# Patient Record
Sex: Female | Born: 1965
Health system: Southern US, Community
[De-identification: ages and names within clinical notes are randomized; demographics above are authoritative.]

## PROBLEM LIST (undated history)

## (undated) DIAGNOSIS — F419 Anxiety disorder, unspecified: Secondary | ICD-10-CM

## (undated) DIAGNOSIS — E119 Type 2 diabetes mellitus without complications: Secondary | ICD-10-CM

## (undated) DIAGNOSIS — I10 Essential (primary) hypertension: Secondary | ICD-10-CM

## (undated) HISTORY — DX: Type 2 diabetes mellitus without complications: E11.9

## (undated) HISTORY — DX: Anxiety disorder, unspecified: F41.9

## (undated) HISTORY — DX: Essential (primary) hypertension: I10

## (undated) HISTORY — PX: LAPAROSCOPY: SHX197

---

## 2018-10-22 DIAGNOSIS — E119 Type 2 diabetes mellitus without complications: Secondary | ICD-10-CM | POA: Diagnosis not present

## 2018-10-22 DIAGNOSIS — I10 Essential (primary) hypertension: Secondary | ICD-10-CM | POA: Diagnosis not present

## 2018-10-22 DIAGNOSIS — Z79899 Other long term (current) drug therapy: Secondary | ICD-10-CM | POA: Diagnosis not present

## 2018-10-22 DIAGNOSIS — J3489 Other specified disorders of nose and nasal sinuses: Secondary | ICD-10-CM | POA: Diagnosis not present

## 2018-11-24 DIAGNOSIS — B372 Candidiasis of skin and nail: Secondary | ICD-10-CM | POA: Diagnosis not present

## 2018-11-24 DIAGNOSIS — I10 Essential (primary) hypertension: Secondary | ICD-10-CM | POA: Diagnosis not present

## 2018-11-24 DIAGNOSIS — Z01419 Encounter for gynecological examination (general) (routine) without abnormal findings: Secondary | ICD-10-CM | POA: Diagnosis not present

## 2018-11-24 DIAGNOSIS — Z6837 Body mass index (BMI) 37.0-37.9, adult: Secondary | ICD-10-CM | POA: Diagnosis not present

## 2018-11-24 DIAGNOSIS — N951 Menopausal and female climacteric states: Secondary | ICD-10-CM | POA: Diagnosis not present

## 2019-05-09 DIAGNOSIS — Z1231 Encounter for screening mammogram for malignant neoplasm of breast: Secondary | ICD-10-CM | POA: Diagnosis not present

## 2019-06-29 DIAGNOSIS — E669 Obesity, unspecified: Secondary | ICD-10-CM | POA: Diagnosis not present

## 2019-06-29 DIAGNOSIS — M9902 Segmental and somatic dysfunction of thoracic region: Secondary | ICD-10-CM | POA: Diagnosis not present

## 2019-06-29 DIAGNOSIS — M542 Cervicalgia: Secondary | ICD-10-CM | POA: Diagnosis not present

## 2019-06-29 DIAGNOSIS — E119 Type 2 diabetes mellitus without complications: Secondary | ICD-10-CM | POA: Diagnosis not present

## 2019-06-29 DIAGNOSIS — M9901 Segmental and somatic dysfunction of cervical region: Secondary | ICD-10-CM | POA: Diagnosis not present

## 2019-06-29 DIAGNOSIS — I10 Essential (primary) hypertension: Secondary | ICD-10-CM | POA: Diagnosis not present

## 2019-06-29 DIAGNOSIS — M546 Pain in thoracic spine: Secondary | ICD-10-CM | POA: Diagnosis not present

## 2019-06-29 DIAGNOSIS — F329 Major depressive disorder, single episode, unspecified: Secondary | ICD-10-CM | POA: Diagnosis not present

## 2019-07-01 DIAGNOSIS — M9902 Segmental and somatic dysfunction of thoracic region: Secondary | ICD-10-CM | POA: Diagnosis not present

## 2019-07-01 DIAGNOSIS — M9901 Segmental and somatic dysfunction of cervical region: Secondary | ICD-10-CM | POA: Diagnosis not present

## 2019-07-01 DIAGNOSIS — M546 Pain in thoracic spine: Secondary | ICD-10-CM | POA: Diagnosis not present

## 2019-07-01 DIAGNOSIS — M542 Cervicalgia: Secondary | ICD-10-CM | POA: Diagnosis not present

## 2019-07-04 DIAGNOSIS — M546 Pain in thoracic spine: Secondary | ICD-10-CM | POA: Diagnosis not present

## 2019-07-04 DIAGNOSIS — M9901 Segmental and somatic dysfunction of cervical region: Secondary | ICD-10-CM | POA: Diagnosis not present

## 2019-07-04 DIAGNOSIS — M9902 Segmental and somatic dysfunction of thoracic region: Secondary | ICD-10-CM | POA: Diagnosis not present

## 2019-07-04 DIAGNOSIS — M542 Cervicalgia: Secondary | ICD-10-CM | POA: Diagnosis not present

## 2019-07-09 DIAGNOSIS — M542 Cervicalgia: Secondary | ICD-10-CM | POA: Diagnosis not present

## 2019-07-09 DIAGNOSIS — M9902 Segmental and somatic dysfunction of thoracic region: Secondary | ICD-10-CM | POA: Diagnosis not present

## 2019-07-09 DIAGNOSIS — M546 Pain in thoracic spine: Secondary | ICD-10-CM | POA: Diagnosis not present

## 2019-07-09 DIAGNOSIS — M9901 Segmental and somatic dysfunction of cervical region: Secondary | ICD-10-CM | POA: Diagnosis not present

## 2019-07-12 DIAGNOSIS — M9903 Segmental and somatic dysfunction of lumbar region: Secondary | ICD-10-CM | POA: Diagnosis not present

## 2019-07-12 DIAGNOSIS — M9901 Segmental and somatic dysfunction of cervical region: Secondary | ICD-10-CM | POA: Diagnosis not present

## 2019-07-12 DIAGNOSIS — M546 Pain in thoracic spine: Secondary | ICD-10-CM | POA: Diagnosis not present

## 2019-07-12 DIAGNOSIS — M545 Low back pain: Secondary | ICD-10-CM | POA: Diagnosis not present

## 2019-07-12 DIAGNOSIS — M9902 Segmental and somatic dysfunction of thoracic region: Secondary | ICD-10-CM | POA: Diagnosis not present

## 2019-07-15 DIAGNOSIS — M9903 Segmental and somatic dysfunction of lumbar region: Secondary | ICD-10-CM | POA: Diagnosis not present

## 2019-07-15 DIAGNOSIS — M9902 Segmental and somatic dysfunction of thoracic region: Secondary | ICD-10-CM | POA: Diagnosis not present

## 2019-07-15 DIAGNOSIS — M546 Pain in thoracic spine: Secondary | ICD-10-CM | POA: Diagnosis not present

## 2019-07-15 DIAGNOSIS — M545 Low back pain: Secondary | ICD-10-CM | POA: Diagnosis not present

## 2019-07-18 DIAGNOSIS — M546 Pain in thoracic spine: Secondary | ICD-10-CM | POA: Diagnosis not present

## 2019-07-18 DIAGNOSIS — M9903 Segmental and somatic dysfunction of lumbar region: Secondary | ICD-10-CM | POA: Diagnosis not present

## 2019-07-18 DIAGNOSIS — M545 Low back pain: Secondary | ICD-10-CM | POA: Diagnosis not present

## 2019-07-18 DIAGNOSIS — M9902 Segmental and somatic dysfunction of thoracic region: Secondary | ICD-10-CM | POA: Diagnosis not present

## 2019-07-26 DIAGNOSIS — M9903 Segmental and somatic dysfunction of lumbar region: Secondary | ICD-10-CM | POA: Diagnosis not present

## 2019-07-26 DIAGNOSIS — M546 Pain in thoracic spine: Secondary | ICD-10-CM | POA: Diagnosis not present

## 2019-07-26 DIAGNOSIS — M545 Low back pain: Secondary | ICD-10-CM | POA: Diagnosis not present

## 2019-07-26 DIAGNOSIS — M9902 Segmental and somatic dysfunction of thoracic region: Secondary | ICD-10-CM | POA: Diagnosis not present

## 2019-08-30 ENCOUNTER — Encounter: Payer: Self-pay | Admitting: Sports Medicine

## 2019-08-30 ENCOUNTER — Other Ambulatory Visit: Payer: Self-pay

## 2019-08-30 ENCOUNTER — Ambulatory Visit (INDEPENDENT_AMBULATORY_CARE_PROVIDER_SITE_OTHER): Payer: BC Managed Care – PPO | Admitting: Sports Medicine

## 2019-08-30 ENCOUNTER — Ambulatory Visit (INDEPENDENT_AMBULATORY_CARE_PROVIDER_SITE_OTHER): Payer: BC Managed Care – PPO

## 2019-08-30 DIAGNOSIS — M1712 Unilateral primary osteoarthritis, left knee: Secondary | ICD-10-CM | POA: Diagnosis not present

## 2019-08-30 DIAGNOSIS — M5412 Radiculopathy, cervical region: Secondary | ICD-10-CM

## 2019-08-30 DIAGNOSIS — M1711 Unilateral primary osteoarthritis, right knee: Secondary | ICD-10-CM

## 2019-08-30 DIAGNOSIS — M542 Cervicalgia: Secondary | ICD-10-CM | POA: Diagnosis not present

## 2019-08-30 MED ORDER — MELOXICAM 15 MG PO TABS: ORAL_TABLET | ORAL | 3 refills | Status: DC

## 2019-08-30 MED ORDER — PREDNISONE 50 MG PO TABS: ORAL_TABLET | ORAL | 0 refills | Status: DC

## 2019-08-30 MED ORDER — CYCLOBENZAPRINE HCL 10 MG PO TABS: ORAL_TABLET | ORAL | 0 refills | Status: AC

## 2019-08-30 NOTE — Assessment & Plan Note (Signed)
This is a very pleasant 54 year old female, she has a long history of pain in her neck with radiation down the right arm to the first through third fingers. Better with abduction of the arm, worse with prolonged downgaze. Consistent with C6 and C7 cervical radiculitis secondary to DDD. Adding x-rays, formal physical therapy, 5 days of prednisone, switch from ibuprofen to meloxicam, adding Flexeril at bedtime. Return to see me in 6 weeks, MRI for interventional planning if no better.

## 2019-08-30 NOTE — Assessment & Plan Note (Signed)
Chronic pain at the medial joint line, gelling, no mechanical symptoms. Adding x-rays, meloxicam will be helpful, return to see me in 4 to 6 weeks for this. This is likely plain osteoarthritis.

## 2019-08-30 NOTE — Progress Notes (Signed)
    Procedures performed today:    None.  Independent interpretation of notes and tests performed by another provider:   None.  Brief History, Exam, Impression, and Recommendations:    Radiculitis of right cervical region This is a very pleasant 54 year old female, she has a long history of pain in her neck with radiation down the right arm to the first through third fingers. Better with abduction of the arm, worse with prolonged downgaze. Consistent with C6 and C7 cervical radiculitis secondary to DDD. Adding x-rays, formal physical therapy, 5 days of prednisone, switch from ibuprofen to meloxicam, adding Flexeril at bedtime. Return to see me in 6 weeks, MRI for interventional planning if no better.  Primary osteoarthritis of right knee Chronic pain at the medial joint line, gelling, no mechanical symptoms. Adding x-rays, meloxicam will be helpful, return to see me in 4 to 6 weeks for this. This is likely plain osteoarthritis.    ___________________________________________ Ihor Austin. Benjamin Stain, M.D., ABFM., CAQSM. Primary Care and Sports Medicine [facility]  Adjunct Instructor of Family Medicine  [employer]

## 2019-09-05 ENCOUNTER — Encounter: Payer: Self-pay | Admitting: Rehabilitative and Restorative Service Providers"

## 2019-09-05 ENCOUNTER — Ambulatory Visit (INDEPENDENT_AMBULATORY_CARE_PROVIDER_SITE_OTHER): Payer: BC Managed Care – PPO | Admitting: Rehabilitative and Restorative Service Providers"

## 2019-09-05 ENCOUNTER — Other Ambulatory Visit: Payer: Self-pay

## 2019-09-05 DIAGNOSIS — R29898 Other symptoms and signs involving the musculoskeletal system: Secondary | ICD-10-CM

## 2019-09-05 DIAGNOSIS — M6281 Muscle weakness (generalized): Secondary | ICD-10-CM

## 2019-09-05 DIAGNOSIS — M5412 Radiculopathy, cervical region: Secondary | ICD-10-CM

## 2019-09-05 DIAGNOSIS — M542 Cervicalgia: Secondary | ICD-10-CM | POA: Diagnosis not present

## 2019-09-05 DIAGNOSIS — R293 Abnormal posture: Secondary | ICD-10-CM

## 2019-09-05 NOTE — Therapy (Signed)
[facility] [place] [address] [place], [place], [address] Phone: [phone]   Fax:  [phone]  Physical Therapy Evaluation  Patient Details  Name: Janice Hurst MRN: [id number] Date of Birth: 1965/11/06 Referring Provider (PT): Dr Benjamin Stain    Encounter Date: 09/05/2019  PT End of Session - 09/05/19 1720    Visit Number  1    Number of Visits  12    Date for PT Re-Evaluation  10/17/19    PT Start Time  1510    PT Stop Time  1557    PT Time Calculation (min)  47 min    Activity Tolerance  Patient tolerated treatment well       Past Medical History:  Diagnosis Date  . Anxiety   . Diabetes mellitus without complication (HCC)   . Hypertension     Past Surgical History:  Procedure Laterality Date  . LAPAROSCOPY      There were no vitals filed for this visit.   Subjective Assessment - 09/05/19 1516    Subjective  Patient reports that she has been having pain in the Rt shoulder area and radiating pain and tingling into the Rt shoulder and arm which has been going on for more than 2 months with no known injury. She tried chiropractic care with no improvement.    Pertinent History  denies any medical problems    Patient Stated Goals  get rid of the pain and get back to normal activities    Currently in Pain?  Yes    Pain Score  7     Pain Location  Neck   neck to shoulder girdle into Rt arm   Pain Orientation  Right    Pain Descriptors / Indicators  Sharp;Tightness;Aching;Throbbing    Pain Type  Acute pain    Pain Radiating Towards  cervical spine to Rt shoulder girdle into the arm    Pain Onset  More than a month ago    Pain Frequency  Constant    Aggravating Factors   bending forward; pulling when moving supine to sit; sitting    Pain Relieving Factors  better up moving; icing; TENS         [facility] PT Assessment - 09/05/19 0001      Assessment   Medical Diagnosis  Rt Cervical radiculopathy      Referring Provider (PT)  Dr Benjamin Stain     Onset Date/Surgical Date  06/27/19    Hand Dominance  Right    Next MD Visit  10/11/19    Prior Therapy  chiropractic care 2-3 weeks; massage therapy       Precautions   Precautions  None      Balance Screen   Has the patient fallen in the past 6 months  No    Has the patient had a decrease in activity level because of a fear of falling?   No    Is the patient reluctant to leave their home because of a fear of falling?   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     Leisure  household chores; child care       Observation/Other Assessments   Focus on Therapeutic Outcomes (FOTO)   49% limitation  Sensation   Additional Comments  tingling into the Rt UE to thumb and index fingers       Posture/Postural Control   Posture Comments  head forward; shoulders rounded and elevated; scapulae abducted and rotated along the thoracic wall       AROM   Right Shoulder Flexion  142 Degrees    Right Shoulder ABduction  160 Degrees    Right Shoulder External Rotation  90 Degrees    Left Shoulder Flexion  158 Degrees    Left Shoulder ABduction  145 Degrees    Left Shoulder External Rotation  94 Degrees    Cervical Flexion  45    Cervical Extension  44   painful neck radiating to Rt UE    Cervical - Right Side Bend  37   sore and tight   Cervical - Left Side Bend  35    Cervical - Right Rotation  67   tight    Cervical - Left Rotation  65      Strength   Overall Strength Comments  Rt UE 4+/5 shoulder flexion; 5-/5 ER       Palpation   Spinal mobility  hypomobile cervical and upper thoracic spine with CPA and lateral mobs     Palpation comment  significant muscular tightness through the Rt ant/lat/post cervical spine; pecs; upper trap; leveator; teres                  Objective measurements completed on examination: See above findings.      [facility] Adult PT Treatment/Exercise - 09/05/19 0001      Neuro Re-ed    Neuro Re-ed Details   initiated postural correction/education       Shoulder Exercises: Seated   Other Seated Exercises  noodle along spine in siting to improve posture and alignment       Shoulder Exercises: Standing   Other Standing Exercises  axial extension 5 sec x 5 reps; scap squeeze 10 sec x 10 reps; L's x 10 all with noodle along spine       Shoulder Exercises: Stretch   Other Shoulder Stretches  instructed in prolonged snow angel in supine to begin at home 1-2 min as tolerated UE in 60-70 deg abd              PT Education - 09/05/19 1557    Education Details  HEP DN POC    Person(s) Educated  Patient    Methods  Explanation;Demonstration;Tactile cues;Verbal cues;Handout    Comprehension  Verbalized understanding;Returned demonstration;Verbal cues required;Tactile cues required          PT Long Term Goals - 09/05/19 1751      PT LONG TERM GOAL #1   Title  Decrease pai by 50-75% allowing patient to progress with exercise and return to more normal ADL's including sleep    Time  6    Period  Weeks    Status  New    Target Date  10/17/19      PT LONG TERM GOAL #2   Title  Full pain free cervical ROM    Time  6    Period  Weeks    Status  New    Target Date  10/17/19      PT LONG TERM GOAL #3   Title  5/5 strength Rt UE    Time  6    Period  Weeks    Status  New    Target Date  10/17/19      PT LONG TERM GOAL #4   Title  Independent in HEP    Time  6    Period  Weeks    Status  New    Target Date  10/17/19      PT LONG TERM GOAL #5   Title  Improve FOTO to </= 34% limitation    Time  6    Period  Weeks    Status  New    Target Date  10/17/19             Plan - 09/05/19 1737    Clinical Impression Statement  Patient presents with at least 2 month history of Rt UE  radiculopathy consistent with nerve irritation at the cervical level as well as muscular tightness in the cervical and shoulder girdle areas. She has poor posture and alignment; limited and painful cervical and shoulder ROM/movement; Rt UE weakness; limited functional activity level; difficulty sleeping; pain on a constant basis. Patient will benefit form PT to address problems identified.    Stability/Clinical Decision Making  Stable/Uncomplicated    Clinical Decision Making  Low    Rehab Potential  Good    PT Frequency  2x / week    PT Duration  6 weeks    PT Treatment/Interventions  Patient/family education;ADLs/Self Care Home Management;Cryotherapy;Electrical Stimulation;Iontophoresis 4mg /ml Dexamethasone;Moist Heat;Traction;Ultrasound;Therapeutic activities;Therapeutic exercise;Neuromuscular re-education;Manual techniques;Dry needling;Taping    PT Next Visit Plan  review HEP; DN and manual work through the cervical and Rt shoulder girdle musculature; postural correction; stretching and strengthening as pt tolerates - (very reactive with limited tolerance for eval and exercise at initial visit); modalities as indicated (has TENS unit at home/does well with ice)    PT Home Exercise Plan  [email or web address]    Consulted and Agree with Plan of Care  Patient       Patient will benefit from skilled therapeutic intervention in order to improve the following deficits and impairments:     Visit Diagnosis: Radiculopathy, cervical region - Plan: PT plan of care cert/re-cert  Cervicalgia - Plan: PT plan of care cert/re-cert  Muscle weakness (generalized) - Plan: PT plan of care cert/re-cert  Other symptoms and signs involving the musculoskeletal system - Plan: PT plan of care cert/re-cert  Abnormal posture - Plan: PT plan of care cert/re-cert     Problem List Patient Active Problem List   Diagnosis Date Noted  . Radiculitis of right cervical region 08/30/2019  . Primary osteoarthritis of right knee  08/30/2019    Lyzette Reinhardt 10/30/2019 PT, MPH  09/05/2019, 5:55 PM  [facility] [address] [place], [place], [place] Phone: [phone]   Fax:  [phone]  Name: Janice Hurst MRN: Abel Presto Date of Birth: [birth year]

## 2019-09-05 NOTE — Patient Instructions (Addendum)
Access Code: [email or web address]: [email or web address]: 05/10/2021Prepared by: Celyn HoltExercises  Seated Cervical Retraction - 3-4 x daily - 7 x weekly - 1 sets - 5 reps - 10 sec hold  Seated Scapular Retraction - 3-4 x daily - 7 x weekly - 1 sets - 10 reps - 5-10 sec hold  Shoulder External Rotation and Scapular Retraction - 2 x daily - 7 x weekly - 1 sets - 10 reps - 1-2 sec hold Patient Education  Trigger Point Dry Needling Trigger Point Dry Needling  . What is Trigger Point Dry Needling (DN)? o DN is a physical therapy technique used to treat muscle pain and dysfunction. Specifically, DN helps deactivate muscle trigger points (muscle knots).  o A thin filiform needle is used to penetrate the skin and stimulate the underlying trigger point. The goal is for a local twitch response (LTR) to occur and for the trigger point to relax. No medication of any kind is injected during the procedure.   . What Does Trigger Point Dry Needling Feel Like?  o The procedure feels different for each individual patient. Some patients report that they do not actually feel the needle enter the skin and overall the process is not painful. Very mild bleeding may occur. However, many patients feel a deep cramping in the muscle in which the needle was inserted. This is the local twitch response.   Marland Kitchen How Will I feel after the treatment? o Soreness is normal, and the onset of soreness may not occur for a few hours. Typically this soreness does not last longer than two days.  o Bruising is uncommon, however; ice can be used to decrease any possible bruising.  o In rare cases feeling tired or nauseous after the treatment is normal. In addition, your symptoms may get worse before they get better, this period will typically not last longer than 24 hours.   . What Can I do After My Treatment? o Increase your hydration by drinking more water for the next 24 hours. o You may place ice or heat on the areas treated  that have become sore, however, do not use heat on inflamed or bruised areas. Heat often brings more relief post needling. o You can continue your regular activities, but vigorous activity is not recommended initially after the treatment for 24 hours. o DN is best combined with other physical therapy such as strengthening, stretching, and other therapies.

## 2019-09-08 ENCOUNTER — Encounter: Payer: Self-pay | Admitting: Rehabilitative and Restorative Service Providers"

## 2019-09-08 ENCOUNTER — Ambulatory Visit (INDEPENDENT_AMBULATORY_CARE_PROVIDER_SITE_OTHER): Payer: BC Managed Care – PPO | Admitting: Rehabilitative and Restorative Service Providers"

## 2019-09-08 ENCOUNTER — Other Ambulatory Visit: Payer: Self-pay

## 2019-09-08 DIAGNOSIS — M542 Cervicalgia: Secondary | ICD-10-CM | POA: Diagnosis not present

## 2019-09-08 DIAGNOSIS — M5412 Radiculopathy, cervical region: Secondary | ICD-10-CM

## 2019-09-08 DIAGNOSIS — R29898 Other symptoms and signs involving the musculoskeletal system: Secondary | ICD-10-CM | POA: Diagnosis not present

## 2019-09-08 DIAGNOSIS — M6281 Muscle weakness (generalized): Secondary | ICD-10-CM | POA: Diagnosis not present

## 2019-09-08 DIAGNOSIS — R293 Abnormal posture: Secondary | ICD-10-CM

## 2019-09-08 NOTE — Patient Instructions (Signed)

## 2019-09-08 NOTE — Therapy (Signed)
[place], [place], [address] Phone: [phone]   Fax:  [phone]  Physical Therapy Treatment  Patient Details  Name: Janice Hurst MRN: [id number] Date of Birth: Dec 20, 1965 Referring Provider (PT): Dr Dianah Field    Encounter Date: 09/08/2019  PT End of Session - 09/08/19 1534    Visit Number  2    Number of Visits  12    Date for PT Re-Evaluation  10/17/19    PT Start Time  7616    PT Stop Time  1625    PT Time Calculation (min)  54 min    Activity Tolerance  Patient tolerated treatment well       Past Medical History:  Diagnosis Date  . Anxiety   . Diabetes mellitus without complication ([place])   . Hypertension     Past Surgical History:  Procedure Laterality Date  . LAPAROSCOPY      There were no vitals filed for this visit.  Subjective Assessment - 09/08/19 1534    Subjective  Patient reports that her symptoms just come and go. Has been working on her exercises and posture at home and at work    Currently in Pain?  Yes    Pain Score  4     Pain Location  Neck    Pain Orientation  Right    Pain Descriptors / Indicators  Aching;Sore;Tightness    Pain Type  Acute pain    Pain Onset  More than a month ago    Pain Frequency  Constant                        [facility] Adult PT Treatment/Exercise - 09/08/19 0001      Shoulder Exercises: Standing   Other Standing Exercises  axial extension 5 sec x 5 reps; scap squeeze 10 sec x 10 reps; L's x 10 all with noodle along spine       Shoulder Exercises: Stretch   Other Shoulder Stretches  prolonged snow angel in supine to begin at home 1-2 min as tolerated UE in 60-70 deg abd       Moist Heat Therapy   Number Minutes Moist Heat  15 Minutes    Moist Heat Location  Cervical;Shoulder   thoracic spine     Electrical Stimulation   Electrical Stimulation Location  Rt cervical and trap area     Electrical Stimulation  Action  TENS    Electrical Stimulation Parameters  to tolerance    Electrical Stimulation Goals  Pain;Tone      Manual Therapy   Manual therapy comments  skilled palpation to assess tissue response to DN; pt prone     Joint Mobilization  cervical PA mobs Grade II     Soft tissue mobilization  deep tissue work Rt posterior lateral cervical musculature; upper thoracic/upper trap/leveator; periscapular musculatue/teres     Myofascial Release  posterior cervical to upper thoracic paraspinals        Trigger Point Dry Needling - 09/08/19 0001    Consent Given?  Yes    Education Handout Provided  Yes    Muscles Treated Head and Neck  Upper trapezius;Oblique capitus;Suboccipitals;Levator scapulae;Scalenes;Cervical multifidi    Muscles Treated Upper Quadrant  Infraspinatus    Upper Trapezius Response  Palpable increased muscle length    Oblique Capitus Response  Palpable increased muscle length    Suboccipitals Response  Palpable increased muscle length    Levator  Scapulae Response  Palpable increased muscle length    Scalenes Response  Palpable increased muscle length    Cervical multifidi Response  Palpable increased muscle length    Infraspinatus Response  Palpable increased muscle length           PT Education - 09/08/19 1612    Education Details  DN    Person(s) Educated  Patient    Methods  Explanation    Comprehension  Verbalized understanding          PT Long Term Goals - 09/05/19 1751      PT LONG TERM GOAL #1   Title  Decrease pai by 50-75% allowing patient to progress with exercise and return to more normal ADL's including sleep    Time  6    Period  Weeks    Status  New    Target Date  10/17/19      PT LONG TERM GOAL #2   Title  Full pain free cervical ROM    Time  6    Period  Weeks    Status  New    Target Date  10/17/19      PT LONG TERM GOAL #3   Title  5/5 strength Rt UE    Time  6    Period  Weeks    Status  New    Target Date  10/17/19       PT LONG TERM GOAL #4   Title  Independent in HEP    Time  6    Period  Weeks    Status  New    Target Date  10/17/19      PT LONG TERM GOAL #5   Title  Improve FOTO to </= 34% limitation    Time  6    Period  Weeks    Status  New    Target Date  10/17/19            Plan - 09/08/19 1612    Clinical Impression Statement  Patiett reports that she has been working on exercise and posture at home - has had some soreness but no increase in pain. Good response to DN/manual work and modalities today. Good tissue release and improved tissue extensibility.    Rehab Potential  Good    PT Frequency  2x / week    PT Duration  6 weeks    PT Treatment/Interventions  Patient/family education;ADLs/Self Care Home Management;Cryotherapy;Electrical Stimulation;Iontophoresis 4mg /ml Dexamethasone;Moist Heat;Traction;Ultrasound;Therapeutic activities;Therapeutic exercise;Neuromuscular re-education;Manual techniques;Dry needling;Taping    PT Next Visit Plan  review HEP; DN and manual work through the cervical and Rt shoulder girdle musculature; postural correction; stretching and strengthening as pt tolerates - (very reactive with limited tolerance for eval and exercise at initial visit); modalities as indicated; assess response to DN and manual work    PT Home Exercise Plan  [email or web address]    Consulted and Agree with Plan of Care  Patient       Patient will benefit from skilled therapeutic intervention in order to improve the following deficits and impairments:     Visit Diagnosis: Radiculopathy, cervical region  Cervicalgia  Muscle weakness (generalized)  Other symptoms and signs involving the musculoskeletal system  Abnormal posture     Problem List Patient Active Problem List   Diagnosis Date Noted  . Radiculitis of right cervical region 08/30/2019  . Primary osteoarthritis of right knee 08/30/2019    Tyrisha Benninger 10/30/2019 PT, MPH  09/08/2019, 4:16 PM  [facility]  [place] [address] [place] [address] [place], [place], [address] Phone: [phone]   Fax:  [phone]  Name: Janice Hurst MRN: [id number] Date of Birth: [birth year]

## 2019-09-12 ENCOUNTER — Other Ambulatory Visit: Payer: Self-pay

## 2019-09-12 ENCOUNTER — Ambulatory Visit (INDEPENDENT_AMBULATORY_CARE_PROVIDER_SITE_OTHER): Payer: BC Managed Care – PPO | Admitting: Rehabilitative and Restorative Service Providers"

## 2019-09-12 ENCOUNTER — Encounter: Payer: Self-pay | Admitting: Rehabilitative and Restorative Service Providers"

## 2019-09-12 DIAGNOSIS — R29898 Other symptoms and signs involving the musculoskeletal system: Secondary | ICD-10-CM | POA: Diagnosis not present

## 2019-09-12 DIAGNOSIS — M542 Cervicalgia: Secondary | ICD-10-CM

## 2019-09-12 DIAGNOSIS — R293 Abnormal posture: Secondary | ICD-10-CM

## 2019-09-12 DIAGNOSIS — M6281 Muscle weakness (generalized): Secondary | ICD-10-CM

## 2019-09-12 DIAGNOSIS — M5412 Radiculopathy, cervical region: Secondary | ICD-10-CM

## 2019-09-12 NOTE — Patient Instructions (Signed)
Access Code: [email or web address]: [email or web address]: 05/10/2021Prepared by: Deshauna Cayson HoltExercises  Seated Cervical Retraction - 3-4 x daily - 7 x weekly - 1 sets - 5 reps - 10 sec hold  Seated Scapular Retraction - 3-4 x daily - 7 x weekly - 1 sets - 10 reps - 5-10 sec hold  Shoulder External Rotation and Scapular Retraction - 2 x daily - 7 x weekly - 1 sets - 10 reps - 1-2 sec hold  Doorway Pec Stretch at 60 Degrees Abduction - 3 x daily - 7 x weekly - 3 reps - 1 sets  Doorway Pec Stretch at 90 Degrees Abduction - 3 x daily - 7 x weekly - 3 reps - 1 sets - 30 seconds hold  Doorway Pec Stretch at 120 Degrees Abduction - 3 x daily - 7 x weekly - 3 reps - 1 sets - 30 second hold hold Patient Education  Trigger Point Dry Needling

## 2019-09-12 NOTE — Therapy (Signed)
[facility] [place] [address] [place], [place], [address] Phone: [phone]   Fax:  [phone]  Physical Therapy Treatment  Patient Details  Name: Janice Hurst MRN: [id number] Date of Birth: 27-Aug-1965 Referring Provider (PT): Dr Benjamin Stain    Encounter Date: 09/12/2019  PT End of Session - 09/12/19 1438    Visit Number  3    Number of Visits  12    Date for PT Re-Evaluation  10/17/19    PT Start Time  1436    PT Stop Time  1526    PT Time Calculation (min)  50 min    Activity Tolerance  Patient tolerated treatment well       Past Medical History:  Diagnosis Date  . Anxiety   . Diabetes mellitus without complication (HCC)   . Hypertension     Past Surgical History:  Procedure Laterality Date  . LAPAROSCOPY      There were no vitals filed for this visit.  Subjective Assessment - 09/12/19 1439    Subjective  Patient reports that she was sore from the needling but that it definitely helped. Working on exercises and posture at work and home    Currently in Pain?  Yes    Pain Score  4     Pain Location  Neck    Pain Orientation  Right    Pain Descriptors / Indicators  Aching;Sore;Tightness    Pain Type  Acute pain                        [facility] Adult PT Treatment/Exercise - 09/12/19 0001      Shoulder Exercises: Standing   Other Standing Exercises  axial extension 5 sec x 5 reps; scap squeeze 10 sec x 10 reps; L's x 10 all with noodle along spine       Shoulder Exercises: Stretch   Other Shoulder Stretches  doorway stretch 3 positions 30 sec x 2       Moist Heat Therapy   Number Minutes Moist Heat  10 Minutes    Moist Heat Location  Cervical;Shoulder   thoracic spine     Electrical Stimulation   Electrical Stimulation Location  Rt cervical and trap area     Electrical Stimulation Action  TENS     Electrical Stimulation Parameters  to tolerance    Electrical Stimulation Goals   Pain;Tone      Manual Therapy   Manual therapy comments  skilled palpation to assess tissue response to DN; pt prone     Joint Mobilization  cervical PA mobs Grade II     Soft tissue mobilization  deep tissue work Rt posterior lateral cervical musculature; focus on the lateral cervical/scaleni musculature    Myofascial Release  posterior cervical to upper thoracic paraspinals        Trigger Point Dry Needling - 09/12/19 0001    Consent Given?  Yes    Education Handout Provided  Previously provided    Oblique Capitus Response  Palpable increased muscle length    Suboccipitals Response  Palpable increased muscle length    Scalenes Response  Palpable increased muscle length    Cervical multifidi Response  Palpable increased muscle length           PT Education - 09/12/19 1456    Education Details  HEP    Person(s) Educated  Patient    Methods  Explanation;Demonstration;Tactile cues;Verbal cues;Handout    Comprehension  Verbalized understanding;Returned demonstration;Verbal cues required;Tactile cues required          PT Long Term Goals - 09/05/19 1751      PT LONG TERM GOAL #1   Title  Decrease pai by 50-75% allowing patient to progress with exercise and return to more normal ADL's including sleep    Time  6    Period  Weeks    Status  New    Target Date  10/17/19      PT LONG TERM GOAL #2   Title  Full pain free cervical ROM    Time  6    Period  Weeks    Status  New    Target Date  10/17/19      PT LONG TERM GOAL #3   Title  5/5 strength Rt UE    Time  6    Period  Weeks    Status  New    Target Date  10/17/19      PT LONG TERM GOAL #4   Title  Independent in HEP    Time  6    Period  Weeks    Status  New    Target Date  10/17/19      PT LONG TERM GOAL #5   Title  Improve FOTO to </= 34% limitation    Time  6    Period  Weeks    Status  New    Target Date  10/17/19            Plan - 09/12/19 1446    Clinical Impression Statement  Good  response to DN/manual work cervical musculature. Patient reports decrease in frequency, intensity and duration of pain as well sa centralization of symptoms. Patient has decreased palpable tightness through the cervical musculature.    Rehab Potential  Good    PT Frequency  2x / week    PT Duration  6 weeks    PT Treatment/Interventions  Patient/family education;ADLs/Self Care Home Management;Cryotherapy;Electrical Stimulation;Iontophoresis 4mg /ml Dexamethasone;Moist Heat;Traction;Ultrasound;Therapeutic activities;Therapeutic exercise;Neuromuscular re-education;Manual techniques;Dry needling;Taping    PT Home Exercise Plan  [email or web address]       Patient will benefit from skilled therapeutic intervention in order to improve the following deficits and impairments:     Visit Diagnosis: Radiculopathy, cervical region  Cervicalgia  Muscle weakness (generalized)  Other symptoms and signs involving the musculoskeletal system  Abnormal posture     Problem List Patient Active Problem List   Diagnosis Date Noted  . Radiculitis of right cervical region 08/30/2019  . Primary osteoarthritis of right knee 08/30/2019    Janice Hurst Nilda Simmer PT, MPH  09/12/2019, 3:16 PM  [facility] [place], [place], [address] Phone: [phone]   Fax:  [phone]  Name: Janice Hurst MRN: [id number] Date of Birth: 1965-09-09

## 2019-09-15 ENCOUNTER — Other Ambulatory Visit: Payer: Self-pay

## 2019-09-15 ENCOUNTER — Ambulatory Visit (INDEPENDENT_AMBULATORY_CARE_PROVIDER_SITE_OTHER): Payer: BC Managed Care – PPO | Admitting: Rehabilitative and Restorative Service Providers"

## 2019-09-15 ENCOUNTER — Encounter: Payer: Self-pay | Admitting: Rehabilitative and Restorative Service Providers"

## 2019-09-15 DIAGNOSIS — M542 Cervicalgia: Secondary | ICD-10-CM

## 2019-09-15 DIAGNOSIS — M6281 Muscle weakness (generalized): Secondary | ICD-10-CM | POA: Diagnosis not present

## 2019-09-15 DIAGNOSIS — R29898 Other symptoms and signs involving the musculoskeletal system: Secondary | ICD-10-CM | POA: Diagnosis not present

## 2019-09-15 DIAGNOSIS — M5412 Radiculopathy, cervical region: Secondary | ICD-10-CM | POA: Diagnosis not present

## 2019-09-15 DIAGNOSIS — R293 Abnormal posture: Secondary | ICD-10-CM

## 2019-09-15 NOTE — Patient Instructions (Signed)
Resisted External Rotation: in Neutral - Bilateral   PALMS UP Sit or stand, tubing in both hands, elbows at sides, bent to 90, forearms forward. Pinch shoulder blades together and rotate forearms out. Keep elbows at sides. Repeat __10__ times per set. Do _2-3___ sets per session. Do _2-3___ sessions per day.   Low Row: Standing   Face anchor, feet shoulder width apart. Palms up, pull arms back, squeezing shoulder blades together. Repeat 10__ times per set. Do 2-3__ sets per session. Do 1-2__ sessions per week. Anchor Height: Waist    Strengthening: Resisted Extension   Hold tubing in right hand, arm forward. Pull arm back, elbow straight. Repeat _10___ times per set. Do 2-3____ sets per session. Do 1-2___ sessions per day.   

## 2019-09-15 NOTE — Therapy (Signed)
[place], [place], [address] Phone: [phone]   Fax:  [phone]  Physical Therapy Treatment  Patient Details  Name: VIOLET SEABURY MRN: [id number] Date of Birth: Jul 05, 1965 Referring Provider (PT): Dr Dianah Field    Encounter Date: 09/15/2019  PT End of Session - 09/15/19 1624    Visit Number  4    Number of Visits  12    Date for PT Re-Evaluation  10/17/19    PT Start Time  1619    PT Stop Time  1707    PT Time Calculation (min)  48 min    Activity Tolerance  Patient tolerated treatment well       Past Medical History:  Diagnosis Date  . Anxiety   . Diabetes mellitus without complication ([place])   . Hypertension     Past Surgical History:  Procedure Laterality Date  . LAPAROSCOPY      There were no vitals filed for this visit.  Subjective Assessment - 09/15/19 1624    Subjective  Patient reports that she is sore the day following the DN - improving with less radicular problems. Doing her exercises. Working on posture and alignment. Less aching during the day.    Currently in Pain?  Yes    Pain Score  2     Pain Location  Neck    Pain Orientation  Right    Pain Descriptors / Indicators  Sore;Aching;Nagging    Pain Type  Acute pain    Pain Frequency  Intermittent                        [facility] Adult PT Treatment/Exercise - 09/15/19 0001      Shoulder Exercises: Standing   Extension  Strengthening;Both;20 reps;Theraband    Theraband Level (Shoulder Extension)  Level 2 (Red)    Row  Strengthening;Both;20 reps;Theraband    Theraband Level (Shoulder Row)  Level 2 (Red)    Retraction  Strengthening;Both;Theraband;20 reps    Theraband Level (Shoulder Retraction)  Level 1 (Yellow)    Other Standing Exercises  axial extension 5 sec x 5 reps; scap squeeze 10 sec x 10 reps; L's x 10 all with noodle along spine       Shoulder Exercises: ROM/Strengthening   UBE  (Upper Arm Bike)  L2 x 4 min; 2 min fwd/2 min back       Shoulder Exercises: Stretch   Other Shoulder Stretches  doorway stretch 3 positions 30 sec x 2       Moist Heat Therapy   Number Minutes Moist Heat  10 Minutes    Moist Heat Location  Cervical;Shoulder   thoracic spine     Electrical Stimulation   Electrical Stimulation Location  Rt cervical and trap area; upper thoracic paraspinals      Electrical Stimulation Action  TENs     Electrical Stimulation Parameters  to tolerance    Electrical Stimulation Goals  Pain;Tone      Manual Therapy   Manual therapy comments  skilled palpation to assess tissue response to DN; pt prone     Joint Mobilization  cervical PA mobs Grade II     Soft tissue mobilization  deep tissue work Rt posterior lateral cervical musculature; lateral cervical/scaleni musculature; thoracic paraspinals     Myofascial Release  posterior cervical to upper thoracic paraspinals        Trigger Point Dry Needling - 09/15/19 0001  Consent Given?  Yes    Education Handout Provided  Previously provided    Other Dry Needling  Rt    Upper Trapezius Response  Palpable increased muscle length    Levator Scapulae Response  Palpable increased muscle length    Scalenes Response  Palpable increased muscle length    Cervical multifidi Response  Palpable increased muscle length    Longissimus Response  Palpable increased muscle length   thoracic paraspinals Rt           PT Education - 09/15/19 1634    Education Details  HEP    Person(s) Educated  Patient    Methods  Explanation;Demonstration;Tactile cues;Verbal cues;Handout    Comprehension  Verbalized understanding;Returned demonstration;Verbal cues required;Tactile cues required          PT Long Term Goals - 09/05/19 1751      PT LONG TERM GOAL #1   Title  Decrease pai by 50-75% allowing patient to progress with exercise and return to more normal ADL's including sleep    Time  6    Period  Weeks     Status  New    Target Date  10/17/19      PT LONG TERM GOAL #2   Title  Full pain free cervical ROM    Time  6    Period  Weeks    Status  New    Target Date  10/17/19      PT LONG TERM GOAL #3   Title  5/5 strength Rt UE    Time  6    Period  Weeks    Status  New    Target Date  10/17/19      PT LONG TERM GOAL #4   Title  Independent in HEP    Time  6    Period  Weeks    Status  New    Target Date  10/17/19      PT LONG TERM GOAL #5   Title  Improve FOTO to </= 34% limitation    Time  6    Period  Weeks    Status  New    Target Date  10/17/19            Plan - 09/15/19 1748    Clinical Impression Statement  Continued progress with decreased radicular symptoms; improved upright posture; cervical mobility and report of decreased pain. Patient has muscular tightness through the Rt thoracic paraspinals; Rt > Lt cervical musculture. Good response to DN with decreased palpable tightness post treatment. Added posterior shoulder girdle strengthening without difficulty. Will benefit from continued manual work through ant/lat/posterior cervical musculature.    Rehab Potential  Good    PT Frequency  2x / week    PT Duration  6 weeks    PT Treatment/Interventions  Patient/family education;ADLs/Self Care Home Management;Cryotherapy;Electrical Stimulation;Iontophoresis 4mg /ml Dexamethasone;Moist Heat;Traction;Ultrasound;Therapeutic activities;Therapeutic exercise;Neuromuscular re-education;Manual techniques;Dry needling;Taping    PT Next Visit Plan  DN and manual work through the cervical and Rt shoulder girdle musculature; postural correction; stretching and strengthening as pt tolerates;; modalities as indicated    PT Home Exercise Plan  [email or web address]    Consulted and Agree with Plan of Care  Patient       Patient will benefit from skilled therapeutic intervention in order to improve the following deficits and impairments:     Visit Diagnosis: Radiculopathy, cervical  region  Cervicalgia  Muscle weakness (generalized)  Other symptoms and signs involving the musculoskeletal system  Abnormal  posture     Problem List Patient Active Problem List   Diagnosis Date Noted  . Radiculitis of right cervical region 08/30/2019  . Primary osteoarthritis of right knee 08/30/2019    Aboubacar Matsuo Rober Minion PT, MPH  09/15/2019, 6:01 PM  [facility] [address] [place] [place], [place], [address] Phone: [phone]   Fax:  [phone]  Name: SCHERRY LAVERNE MRN: [id number] Date of Birth: 1966-01-26

## 2019-09-16 MED ORDER — PREDNISONE 10 MG (48) PO TBPK: ORAL_TABLET | Freq: Every day | ORAL | 0 refills | Status: DC

## 2019-09-19 ENCOUNTER — Encounter: Payer: Self-pay | Admitting: Rehabilitative and Restorative Service Providers"

## 2019-09-19 ENCOUNTER — Other Ambulatory Visit: Payer: Self-pay

## 2019-09-19 ENCOUNTER — Ambulatory Visit (INDEPENDENT_AMBULATORY_CARE_PROVIDER_SITE_OTHER): Payer: BC Managed Care – PPO | Admitting: Rehabilitative and Restorative Service Providers"

## 2019-09-19 DIAGNOSIS — M542 Cervicalgia: Secondary | ICD-10-CM

## 2019-09-19 DIAGNOSIS — M5412 Radiculopathy, cervical region: Secondary | ICD-10-CM

## 2019-09-19 DIAGNOSIS — M6281 Muscle weakness (generalized): Secondary | ICD-10-CM | POA: Diagnosis not present

## 2019-09-19 DIAGNOSIS — R29898 Other symptoms and signs involving the musculoskeletal system: Secondary | ICD-10-CM

## 2019-09-19 DIAGNOSIS — R293 Abnormal posture: Secondary | ICD-10-CM

## 2019-09-19 NOTE — Therapy (Signed)
[facility] [place] [address] [place] [address] [place], [place], [address] Phone: [phone]   Fax:  [phone]  Physical Therapy Treatment  Patient Details  Name: Janice Hurst MRN: [id number] Date of Birth: [birth year] Referring Provider (PT): Dr Benjamin Stain    Encounter Date: 09/19/2019  PT End of Session - 09/19/19 1520    Visit Number  5    Number of Visits  12    Date for PT Re-Evaluation  10/17/19    PT Start Time  1516    PT Stop Time  1600    PT Time Calculation (min)  44 min    Activity Tolerance  Patient tolerated treatment well       Past Medical History:  Diagnosis Date  . Anxiety   . Diabetes mellitus without complication (HCC)   . Hypertension     Past Surgical History:  Procedure Laterality Date  . LAPAROSCOPY      There were no vitals filed for this visit.  Subjective Assessment - 09/19/19 1518    Subjective  The patient reports her neck is feeling significantly better.  She is doing her exercises and was able to travel this weekend.  Her low back is irritated today from travel in the car x 4 hours.  DN has helped reduce muscle tightness.    Pertinent History  denies any medical problems    Patient Stated Goals  get rid of the pain and get back to normal activities    Currently in Pain?  No/denies   discomfort   Pain Radiating Towards  sometimes feels a "crunching" sensation in the R paraspinal musculature    Pain Onset  More than a month ago    Pain Frequency  Intermittent    Pain Relieving Factors  TENSj, dry needling         [facility] PT Assessment - 09/19/19 1553      AROM   Overall AROM Comments  no pain with AROM today    Right Shoulder ABduction  160 Degrees   demontrates she can lift her arm to the side against gravity   Cervical Flexion  45    Cervical Extension  45    Cervical - Right Side Bend  32    Cervical - Left Side Bend  30                    [facility] Adult PT  Treatment/Exercise - 09/19/19 1521      Exercises   Exercises  Neck      Neck Exercises: Standing   Neck Retraction  5 reps    Other Standing Exercises  scapular squeeze with pool noodle      Neck Exercises: Supine   Neck Retraction  10 reps    Other Supine Exercise  retraction + horizontal abduction with yellow theraband x 10 reps      Neck Exercises: Prone   W Back  10 reps    Other Prone Exercise  shoulder extension x 10 reps with scap squeeze x 10 reps      Shoulder Exercises: ROM/Strengthening   UBE (Upper Arm Bike)  L2 x 2.5 minutes forward/ 1 minute backward      Manual Therapy   Manual Therapy  Soft tissue mobilization;Joint mobilization;Scapular mobilization    Manual therapy comments  to reduce muscle tightness, trigger points, and reduce pain    Joint Mobilization  cervical PA mobilization grade II prone, PA c-spine grade III mob mid  c-spine, lateral mob grade II R>L and L>R in supine    Soft tissue mobilization  deep tissue mobiization R posterior lateral cervical muscuature    Myofascial Release  posterior cervical and sub occipitals    Scapular Mobilization  Left sidelying, R scapular mobilization with STM medial border and into levator                  PT Long Term Goals - 09/05/19 1751      PT LONG TERM GOAL #1   Title  Decrease pai by 50-75% allowing patient to progress with exercise and return to more normal ADL's including sleep    Time  6    Period  Weeks    Status  New    Target Date  10/17/19      PT LONG TERM GOAL #2   Title  Full pain free cervical ROM    Time  6    Period  Weeks    Status  New    Target Date  10/17/19      PT LONG TERM GOAL #3   Title  5/5 strength Rt UE    Time  6    Period  Weeks    Status  New    Target Date  10/17/19      PT LONG TERM GOAL #4   Title  Independent in HEP    Time  6    Period  Weeks    Status  New    Target Date  10/17/19      PT LONG TERM GOAL #5   Title  Improve FOTO to </= 34%  limitation    Time  6    Period  Weeks    Status  New    Target Date  10/17/19            Plan - 09/19/19 2023    Clinical Impression Statement  The patient continues with reduced pain, noting occasional discomfort R paraspinal mid c-spine region. She also noted end range tightness for flexion/extension that improved with c-spine mobilization noting pain free ROM today.  PT performed STM R upper trap and levator and worked on posterior shoulder girdle strengthening and neck stabiization.  Continue working to [employer].    Rehab Potential  Good    PT Frequency  2x / week    PT Duration  6 weeks    PT Treatment/Interventions  Patient/family education;ADLs/Self Care Home Management;Cryotherapy;Electrical Stimulation;Iontophoresis 4mg /ml Dexamethasone;Moist Heat;Traction;Ultrasound;Therapeutic activities;Therapeutic exercise;Neuromuscular re-education;Manual techniques;Dry needling;Taping    PT Next Visit Plan  DN and manual work through the cervical and Rt shoulder girdle musculature; postural correction; stretching and strengthening as pt tolerates;; modalities as indicated    PT Home Exercise Plan  [email or web address]    Consulted and Agree with Plan of Care  Patient       Patient will benefit from skilled therapeutic intervention in order to improve the following deficits and impairments:     Visit Diagnosis: Radiculopathy, cervical region  Cervicalgia  Muscle weakness (generalized)  Other symptoms and signs involving the musculoskeletal system  Abnormal posture     Problem List Patient Active Problem List   Diagnosis Date Noted  . Radiculitis of right cervical region 08/30/2019  . Primary osteoarthritis of right knee 08/30/2019    [place], PT 09/19/2019, 8:26 PM  [facility] [place], [place], [address] Phone: [phone]   Fax:  [phone]  Name: Janice Hurst MRN:  [id number] Date of Birth:  1966-01-07

## 2019-09-22 ENCOUNTER — Ambulatory Visit (INDEPENDENT_AMBULATORY_CARE_PROVIDER_SITE_OTHER): Payer: BC Managed Care – PPO | Admitting: Physical Therapy

## 2019-09-22 ENCOUNTER — Other Ambulatory Visit: Payer: Self-pay

## 2019-09-22 ENCOUNTER — Encounter: Payer: Self-pay | Admitting: Physical Therapy

## 2019-09-22 DIAGNOSIS — R29898 Other symptoms and signs involving the musculoskeletal system: Secondary | ICD-10-CM | POA: Diagnosis not present

## 2019-09-22 DIAGNOSIS — M542 Cervicalgia: Secondary | ICD-10-CM | POA: Diagnosis not present

## 2019-09-22 DIAGNOSIS — M5412 Radiculopathy, cervical region: Secondary | ICD-10-CM

## 2019-09-22 DIAGNOSIS — R293 Abnormal posture: Secondary | ICD-10-CM

## 2019-09-22 DIAGNOSIS — M6281 Muscle weakness (generalized): Secondary | ICD-10-CM

## 2019-09-22 NOTE — Therapy (Signed)
[place], [place], [address] Phone: [phone]   Fax:  [phone]  Physical Therapy Treatment  Patient Details  Name: Janice Hurst MRN: [id number] Date of Birth: 24-Sep-1965 Referring Provider (PT): Dr Dianah Field    Encounter Date: 09/22/2019  PT End of Session - 09/22/19 1655    Visit Number  6    Number of Visits  12    Date for PT Re-Evaluation  10/17/19    PT Start Time  2671    PT Stop Time  2458    PT Time Calculation (min)  51 min    Activity Tolerance  Patient tolerated treatment well;No increased pain    Behavior During Therapy  WFL for tasks assessed/performed       Past Medical History:  Diagnosis Date  . Anxiety   . Diabetes mellitus without complication ([place])   . Hypertension     Past Surgical History:  Procedure Laterality Date  . LAPAROSCOPY      There were no vitals filed for this visit.  Subjective Assessment - 09/22/19 1631    Subjective  Pt reports she is painfree today.  She slept on her Lt side for first time last night; tolerated it well but had some minor stiffness. She started another "burst" round of Prednisone last Saturday, and is tapering down until Wednesday.    Pertinent History  denies any medical problems    Patient Stated Goals  get rid of the pain and get back to normal activities    Currently in Pain?  No/denies         [facility] PT Assessment - 09/22/19 0001      Assessment   Medical Diagnosis  Rt Cervical radiculopathy     Referring Provider (PT)  Dr Dianah Field     Onset Date/Surgical Date  06/27/19    Hand Dominance  Right    Next MD Visit  10/11/19    Prior Therapy  chiropractic care 2-3 weeks; massage therapy       AROM   Cervical - Right Side Bend  34,  34 after IASTM   Cervical - Left Side Bend  32   47 after iASTM     Strength   Strength Assessment Site  Shoulder    Right/Left Shoulder  Right    Right Shoulder Flexion   4-/5    Right Shoulder Extension  4+/5    Right Shoulder ABduction  3+/5    Right Shoulder Internal Rotation  5/5    Right Shoulder External Rotation  4/5       [facility] Adult PT Treatment/Exercise - 09/22/19 0001      Self-Care   Self-Care  Other Self-Care Comments    Other Self-Care Comments   pt educated on self massage with ball to pec, upper trap, and posterior shoulder girdle muscuture; pt returned demo with cues.       Neck Exercises: Seated   Lateral Flexion  Right;Left;5 reps      Neck Exercises: Prone   W Back  10 reps   with axial ext   Other Prone Exercise  axial ext, shoulder extension x 10 reps with scap squeeze x 10 reps      Shoulder Exercises: Standing   Flexion  Strengthening;Right;5 reps   2 sets, one set with 1#, one set with 2#   Shoulder Flexion Weight (lbs)  1,2    Flexion Limitations  to 90 deg; mirror for visual feedback  ABduction  Right;10 reps;Strengthening;Weights    Shoulder ABduction Weight (lbs)  1    ABduction Limitations  to 90 deg; mirror for visual feedback      Shoulder Exercises: ROM/Strengthening   UBE (Upper Arm Bike)  L2 x 1.5 minutes forward/ 1 minute backward      Shoulder Exercises: Stretch   Other Shoulder Stretches  thoracic ext over back of chair hands supporting head.     Other Shoulder Stretches  3 position doorway stretch x 30 sec x 2 reps each side.       Manual Therapy   Manual therapy comments  performed in sitting; to reduce muscle tightness, trigger points, and reduce pain    Soft tissue mobilization  IASTM to bilat cervical scalenes, upper trap, levator                  PT Long Term Goals - 09/22/19 1646      PT LONG TERM GOAL #1   Title  Decrease pain by 50-75% allowing patient to progress with exercise and return to more normal ADL's including sleep    Time  6    Period  Weeks    Status  Partially Met      PT LONG TERM GOAL #2   Title  Full pain free cervical ROM    Time  6    Period  Weeks     Status  Partially Met      PT LONG TERM GOAL #3   Title  5/5 strength Rt UE    Time  6    Period  Weeks    Status  On-going      PT LONG TERM GOAL #4   Title  Independent in HEP    Time  6    Period  Weeks    Status  On-going      PT LONG TERM GOAL #5   Title  Improve FOTO to </= 34% limitation    Time  6    Period  Weeks    Status  On-going            Plan - 09/22/19 1727    Clinical Impression Statement  Pt's overall pain level has significantly reduces since initiating therapy; reporting improved ability with ADLs.  Pt continues with Rt shoulder strength deficits; added light resistance exercises for Rt shoulder under 90 deg (in front of mirror for postural feedback) without difficulty.  Positive response to IASTM to upper trap and scalenes; improved Lt lateral cervical flexion.  Pt tolerated all of treatment without increase in pain.  Progressing well with all LTGs.    Rehab Potential  Good    PT Frequency  2x / week    PT Duration  6 weeks    PT Treatment/Interventions  Patient/family education;ADLs/Self Care Home Management;Cryotherapy;Electrical Stimulation;Iontophoresis 84m/ml Dexamethasone;Moist Heat;Traction;Ultrasound;Therapeutic activities;Therapeutic exercise;Neuromuscular re-education;Manual techniques;Dry needling;Taping    PT Next Visit Plan  DN and manual work through the cervical and Rt shoulder girdle musculature; postural correction; stretching and strengthening as pt tolerates;; modalities as indicated    PT Home Exercise Plan  [email or web address]    Consulted and Agree with Plan of Care  Patient       Patient will benefit from skilled therapeutic intervention in order to improve the following deficits and impairments:     Visit Diagnosis: Radiculopathy, cervical region  Cervicalgia  Muscle weakness (generalized)  Other symptoms and signs involving the musculoskeletal system  Abnormal posture  Problem List Patient Active Problem List   Diagnosis  Date Noted  . Radiculitis of right cervical region 08/30/2019  . Primary osteoarthritis of right knee 08/30/2019   Kerin Perna, PTA 09/22/19 5:38 PM  [place], [place], [address] Phone: [phone]   Fax:  [phone]  Name: Janice Hurst MRN: [id number] Date of Birth: [birth year]

## 2019-09-22 NOTE — Patient Instructions (Signed)
Access Code: [email or web address]: [email or web address]: 05/27/2021Prepared by: [facility] - [facility] [place] [place]  Seated Cervical Retraction - 3-4 x daily - 7 x weekly - 1 sets - 5 reps - 10 sec hold  Seated Scapular Retraction - 3-4 x daily - 7 x weekly - 1 sets - 10 reps - 5-10 sec hold  Shoulder External Rotation and Scapular Retraction - 2 x daily - 7 x weekly - 1 sets - 10 reps - 1-2 sec hold  Doorway Pec Stretch at 60 Degrees Abduction - 3 x daily - 7 x weekly - 3 reps - 1 sets  Doorway Pec Stretch at 90 Degrees Abduction - 3 x daily - 7 x weekly - 3 reps - 1 sets - 30 seconds hold  Doorway Pec Stretch at 120 Degrees Abduction - 3 x daily - 7 x weekly - 3 reps - 1 sets - 30 second hold hold  Standing Shoulder Flexion with Dumbbells - 1 x daily - 7 x weekly - 1-2 sets - 10 reps  Standing Single Arm Shoulder Abduction with Dumbbell - Thumb Up - 1 x daily - 7 x weekly - 1-2 sets - 10 reps

## 2019-09-28 ENCOUNTER — Other Ambulatory Visit: Payer: Self-pay

## 2019-09-28 ENCOUNTER — Ambulatory Visit (INDEPENDENT_AMBULATORY_CARE_PROVIDER_SITE_OTHER): Payer: BC Managed Care – PPO | Admitting: Rehabilitative and Restorative Service Providers"

## 2019-09-28 ENCOUNTER — Encounter: Payer: Self-pay | Admitting: Rehabilitative and Restorative Service Providers"

## 2019-09-28 DIAGNOSIS — M6281 Muscle weakness (generalized): Secondary | ICD-10-CM

## 2019-09-28 DIAGNOSIS — M542 Cervicalgia: Secondary | ICD-10-CM

## 2019-09-28 DIAGNOSIS — R29898 Other symptoms and signs involving the musculoskeletal system: Secondary | ICD-10-CM

## 2019-09-28 DIAGNOSIS — R293 Abnormal posture: Secondary | ICD-10-CM

## 2019-09-28 DIAGNOSIS — M5412 Radiculopathy, cervical region: Secondary | ICD-10-CM

## 2019-09-28 NOTE — Therapy (Signed)
[place], [place], [address] Phone: [phone]   Fax:  [phone]  Physical Therapy Treatment  Patient Details  Name: Janice Hurst MRN: [id number] Date of Birth: 1965/04/30 Referring Provider (PT): Dr Dianah Field    Encounter Date: 09/28/2019  PT End of Session - 09/28/19 1643    Visit Number  7    Number of Visits  12    Date for PT Re-Evaluation  10/17/19    PT Start Time  2637    PT Stop Time  1646    PT Time Calculation (min)  48 min       Past Medical History:  Diagnosis Date  . Anxiety   . Diabetes mellitus without complication ([place])   . Hypertension     Past Surgical History:  Procedure Laterality Date  . LAPAROSCOPY      There were no vitals filed for this visit.  Subjective Assessment - 09/28/19 1607    Subjective  Continued improvement. Only has the UE symptoms occasionally. Finishes second predisone today. Ready to be off the meds. Working on posture and exercises at home.    Currently in Pain?  No/denies    Pain Location  Shoulder    Pain Orientation  Right    Pain Descriptors / Indicators  Sore    Pain Type  Acute pain                        [facility] Adult PT Treatment/Exercise - 09/28/19 0001      Neuro Re-ed    Neuro Re-ed Details   postural correction/education       Neck Exercises: Standing   Other Standing Exercises  scap squeeze with pool noodle      Shoulder Exercises: Standing   Other Standing Exercises  axial extension 5 sec x 5 reps; scap squeeze 10 sec x 10 reps; L's x 10 all with noodle along spine       Shoulder Exercises: ROM/Strengthening   UBE (Upper Arm Bike)  L2 x 2 minutes forward/ 2 minute backward      Shoulder Exercises: Stretch   Other Shoulder Stretches  3 position doorway stretch x 30 sec x 2 reps each side.       Moist Heat Therapy   Number Minutes Moist Heat  10 Minutes    Moist Heat Location   Cervical;Shoulder   Rt     Manual Therapy   Manual therapy comments  skilled palpation to monitor tissue response to DN/manual work     Joint Mobilization  cervical PA mobilization grade II prone, PA c-spine grade III mob mid c-spine    Soft tissue mobilization  deep tissue work through the Rt > Lt anterior/lateral/posterior cervical musculature; upper trap; leveator;     Myofascial Release  Rt anterior/lateral cervical musculature     Passive ROM  lateral flexion to tissue limits     Manual Traction  manual traction 20 sec hold x 3 end of treatment        Trigger Point Dry Needling - 09/28/19 0001    Consent Given?  Yes    Education Handout Provided  Previously provided    Other Dry Needling  Rt    Sternocleidomastoid Response  Palpable increased muscle length    Scalenes Response  Palpable increased muscle length    Cervical multifidi Response  Palpable increased muscle length  PT Long Term Goals - 09/22/19 1646      PT LONG TERM GOAL #1   Title  Decrease pain by 50-75% allowing patient to progress with exercise and return to more normal ADL's including sleep    Time  6    Period  Weeks    Status  Partially Met      PT LONG TERM GOAL #2   Title  Full pain free cervical ROM    Time  6    Period  Weeks    Status  Partially Met      PT LONG TERM GOAL #3   Title  5/5 strength Rt UE    Time  6    Period  Weeks    Status  On-going      PT LONG TERM GOAL #4   Title  Independent in HEP    Time  6    Period  Weeks    Status  On-going      PT LONG TERM GOAL #5   Title  Improve FOTO to </= 34% limitation    Time  6    Period  Weeks    Status  On-going            Plan - 09/28/19 1644    Clinical Impression Statement  Progressing well with only occasional Rt UE radicular symptoms. Patient is increasing activities and working on exercises as well as postural correction at home. Note less palpable tightness through the Rt cervical and shoulder  girdle area. Good response to DN/manual work. Progressing well toward stated goals of therapy.    Rehab Potential  Good    PT Frequency  2x / week    PT Duration  6 weeks    PT Treatment/Interventions  Patient/family education;ADLs/Self Care Home Management;Cryotherapy;Electrical Stimulation;Iontophoresis 7m/ml Dexamethasone;Moist Heat;Traction;Ultrasound;Therapeutic activities;Therapeutic exercise;Neuromuscular re-education;Manual techniques;Dry needling;Taping    PT Next Visit Plan  DN and manual work through the cervical and Rt shoulder girdle musculature; postural correction; stretching and strengthening as pt tolerates;; modalities as indicated - progress with posteriro shoudler girdle strengthening - TB exercises; prone ex    PT Home Exercise Plan  [email or web address]    Consulted and Agree with Plan of Care  Patient       Patient will benefit from skilled therapeutic intervention in order to improve the following deficits and impairments:     Visit Diagnosis: Radiculopathy, cervical region  Cervicalgia  Muscle weakness (generalized)  Other symptoms and signs involving the musculoskeletal system  Abnormal posture     Problem List Patient Active Problem List   Diagnosis Date Noted  . Radiculitis of right cervical region 08/30/2019  . Primary osteoarthritis of right knee 08/30/2019    Kairee Isa PNilda SimmerPT, MPH 09/28/2019, 4:50 PM  [facility] [address]Phone: [phone]  Fax:  [phone] Name: Janice CALANDROMRN: [id number] of Birth: [birth year]

## 2019-09-30 ENCOUNTER — Encounter: Payer: Self-pay | Admitting: Rehabilitative and Restorative Service Providers"

## 2019-09-30 ENCOUNTER — Ambulatory Visit (INDEPENDENT_AMBULATORY_CARE_PROVIDER_SITE_OTHER): Payer: BC Managed Care – PPO | Admitting: Rehabilitative and Restorative Service Providers"

## 2019-09-30 DIAGNOSIS — R29898 Other symptoms and signs involving the musculoskeletal system: Secondary | ICD-10-CM

## 2019-09-30 DIAGNOSIS — R293 Abnormal posture: Secondary | ICD-10-CM

## 2019-09-30 DIAGNOSIS — M6281 Muscle weakness (generalized): Secondary | ICD-10-CM

## 2019-09-30 DIAGNOSIS — M5412 Radiculopathy, cervical region: Secondary | ICD-10-CM

## 2019-09-30 DIAGNOSIS — M542 Cervicalgia: Secondary | ICD-10-CM

## 2019-09-30 NOTE — Therapy (Signed)
[place], [place], [address] Phone: [phone]   Fax:  [phone]  Physical Therapy Treatment  Patient Details  Name: Janice Hurst MRN: [id number] Date of Birth: [birth year] Referring Provider (PT): Dr Dianah Field    Encounter Date: 09/30/2019  PT End of Session - 09/30/19 1533    Visit Number  8    Number of Visits  12    Date for PT Re-Evaluation  10/17/19    PT Start Time  1532    PT Stop Time  0813    PT Time Calculation (min)  49 min       Past Medical History:  Diagnosis Date  . Anxiety   . Diabetes mellitus without complication ([place])   . Hypertension     Past Surgical History:  Procedure Laterality Date  . LAPAROSCOPY      There were no vitals filed for this visit.  Subjective Assessment - 09/30/19 1534    Subjective  Neck and shoulder area feel tighter today. Not really painful - just knows it's there. Working on exercises at home. Feels tired today.    Currently in Pain?  No/denies                        [facility] Adult PT Treatment/Exercise - 09/30/19 0001      Shoulder Exercises: Standing   Other Standing Exercises  axial extension 5 sec x 5 reps; scap squeeze 10 sec x 10 reps; L's x 10 all with noodle along spine     Other Standing Exercises  lat pull TB over door blue TB x 10; reverse wall push up x 10 x 2 sets; scapular depression blue TB over door 10 x 2 sets       Shoulder Exercises: ROM/Strengthening   UBE (Upper Arm Bike)  L3 x 2 minutes forward/ 1 minute backward      Shoulder Exercises: Stretch   Other Shoulder Stretches  3 position doorway stretch x 30 sec x 2 reps each side.       Moist Heat Therapy   Number Minutes Moist Heat  10 Minutes    Moist Heat Location  Cervical;Shoulder   Rt     Manual Therapy   Manual therapy comments  skilled palpation to monitor tissue response to DN/manual work     Soft tissue mobilization  deep tissue  work through the Rt > Lt posterior cervical musculature; upper trap; leveator; Rt teres/lats     Myofascial Release  posterior cervical musculature        Trigger Point Dry Needling - 09/30/19 0001    Consent Given?  Yes    Education Handout Provided  Previously provided    Other Dry Needling  Rt    Upper Trapezius Response  Palpable increased muscle length    Levator Scapulae Response  Palpable increased muscle length    Cervical multifidi Response  Palpable increased muscle length    Longissimus Response  Palpable increased muscle length    Teres major Response  Palpable increased muscle length    Teres minor Response  Palpable increased muscle length           PT Education - 09/30/19 1554    Education Details  HEP    Person(s) Educated  Patient    Methods  Explanation;Demonstration;Tactile cues;Verbal cues;Handout    Comprehension  Verbalized understanding;Returned demonstration;Verbal cues required;Tactile cues required  PT Long Term Goals - 09/22/19 1646      PT LONG TERM GOAL #1   Title  Decrease pain by 50-75% allowing patient to progress with exercise and return to more normal ADL's including sleep    Time  6    Period  Weeks    Status  Partially Met      PT LONG TERM GOAL #2   Title  Full pain free cervical ROM    Time  6    Period  Weeks    Status  Partially Met      PT LONG TERM GOAL #3   Title  5/5 strength Rt UE    Time  6    Period  Weeks    Status  On-going      PT LONG TERM GOAL #4   Title  Independent in HEP    Time  6    Period  Weeks    Status  On-going      PT LONG TERM GOAL #5   Title  Improve FOTO to </= 34% limitation    Time  6    Period  Weeks    Status  On-going            Plan - 09/30/19 1544    Clinical Impression Statement  Patient reports that she is tired and aching today. She did finish her prednisone and decrease use of meds this week. Patient has palpable tightness through the Rt lateral and posterior  cervical musculature; upper trap; leveator; Rt teres/lats at posterior shoulder. Added posterior shoulder girdle strengthening musculature without difficulty. Good response to DN/manual therapy. Progressing well.    Rehab Potential  Good    PT Frequency  2x / week    PT Duration  6 weeks    PT Treatment/Interventions  Patient/family education;ADLs/Self Care Home Management;Cryotherapy;Electrical Stimulation;Iontophoresis 20m/ml Dexamethasone;Moist Heat;Traction;Ultrasound;Therapeutic activities;Therapeutic exercise;Neuromuscular re-education;Manual techniques;Dry needling;Taping    PT Next Visit Plan  DN and manual work through the cervical and Rt shoulder girdle musculature; postural correction; stretching and strengthening as pt tolerates;; modalities as indicated - progress with posteriro shoudler girdle strengthening - TB exercises; prone ex    PT Home Exercise Plan  [email or web address]    Consulted and Agree with Plan of Care  Patient       Patient will benefit from skilled therapeutic intervention in order to improve the following deficits and impairments:     Visit Diagnosis: Radiculopathy, cervical region  Cervicalgia  Muscle weakness (generalized)  Other symptoms and signs involving the musculoskeletal system  Abnormal posture     Problem List Patient Active Problem List   Diagnosis Date Noted  . Radiculitis of right cervical region 08/30/2019  . Primary osteoarthritis of right knee 08/30/2019    Rasheen Schewe PNilda SimmerPT, MPH  09/30/2019, 4:30 PM  [facility] [address]Phone: [phone]  Fax:  [phone] Name: TJACQUELYNN FRIENDMRN: [id number] of Birth: [birth year]

## 2019-09-30 NOTE — Patient Instructions (Signed)
Access Code: [email or web address]: [email or web address]: 06/04/2021Prepared by: Keyatta Tolles HoltExercises  Seated Cervical Retraction - 3-4 x daily - 7 x weekly - 1 sets - 5 reps - 10 sec hold  Seated Scapular Retraction - 3-4 x daily - 7 x weekly - 1 sets - 10 reps - 5-10 sec hold  Shoulder External Rotation and Scapular Retraction - 2 x daily - 7 x weekly - 1 sets - 10 reps - 1-2 sec hold  Doorway Pec Stretch at 60 Degrees Abduction - 3 x daily - 7 x weekly - 3 reps - 1 sets  Doorway Pec Stretch at 90 Degrees Abduction - 3 x daily - 7 x weekly - 3 reps - 1 sets - 30 seconds hold  Doorway Pec Stretch at 120 Degrees Abduction - 3 x daily - 7 x weekly - 3 reps - 1 sets - 30 second hold hold  Standing Shoulder Flexion with Dumbbells - 1 x daily - 7 x weekly - 1-2 sets - 10 reps  Standing Single Arm Shoulder Abduction with Dumbbell - Thumb Up - 1 x daily - 7 x weekly - 1-2 sets - 10 reps  Standing Lat Pull Down with Resistance - Elbows Bent - 1 x daily - 7 x weekly - 1-2 sets - 10 reps - 3 sec hold    reverse wall push up - x 10  Scapular depression blue TB - x10

## 2019-10-05 ENCOUNTER — Ambulatory Visit (INDEPENDENT_AMBULATORY_CARE_PROVIDER_SITE_OTHER): Payer: 59 | Admitting: Rehabilitative and Restorative Service Providers"

## 2019-10-05 ENCOUNTER — Other Ambulatory Visit: Payer: Self-pay

## 2019-10-05 ENCOUNTER — Encounter: Payer: Self-pay | Admitting: Rehabilitative and Restorative Service Providers"

## 2019-10-05 DIAGNOSIS — R29898 Other symptoms and signs involving the musculoskeletal system: Secondary | ICD-10-CM | POA: Diagnosis not present

## 2019-10-05 DIAGNOSIS — M6281 Muscle weakness (generalized): Secondary | ICD-10-CM

## 2019-10-05 DIAGNOSIS — M5412 Radiculopathy, cervical region: Secondary | ICD-10-CM | POA: Diagnosis not present

## 2019-10-05 DIAGNOSIS — M542 Cervicalgia: Secondary | ICD-10-CM

## 2019-10-05 DIAGNOSIS — R293 Abnormal posture: Secondary | ICD-10-CM

## 2019-10-05 NOTE — Therapy (Addendum)
[place], [place], [address] Phone: [phone]   Fax:  [phone]  Physical Therapy Treatment  Patient Details  Name: CHARLEY LAFRANCE MRN: [id number] Date of Birth: 1965/05/08 Referring Provider (PT): Dr Dianah Field    Encounter Date: 10/05/2019  PT End of Session - 10/05/19 1606    Visit Number  9    Number of Visits  12    Date for PT Re-Evaluation  10/17/19    PT Start Time  1600    PT Stop Time  1649    PT Time Calculation (min)  49 min    Activity Tolerance  Patient tolerated treatment well;No increased pain       Past Medical History:  Diagnosis Date  . Anxiety   . Diabetes mellitus without complication ([place])   . Hypertension     Past Surgical History:  Procedure Laterality Date  . LAPAROSCOPY      There were no vitals filed for this visit.  Subjective Assessment - 10/05/19 1607    Subjective  Patient reports that she is tired today. She has no pain into the Rt UE and has not had symptoms in the Rt UE in "a long time"    Currently in Pain?  No/denies                        [facility] Adult PT Treatment/Exercise - 10/05/19 0001      Neck Exercises: Standing   Wall Push Ups Limitations  reverse wal push up 10 sec x 10 reps     Other Standing Exercises  wall plank 30 sec x 4 reps       Neck Exercises: Prone   Other Prone Exercise  axial ext, shoulder extension x 10 reps with scap squeeze x 5 reps; W's x 5 sec hold x 5 reps; T's 5 sec hold x 5; superman 5 sec hold x 5       Shoulder Exercises: Standing   Other Standing Exercises  axial extension 5 sec x 5 reps; scap squeeze 10 sec x 10 reps; L's x 10 all with noodle along spine       Shoulder Exercises: ROM/Strengthening   UBE (Upper Arm Bike)  L3 x 2 minutes forward/ 2 minute backward      Moist Heat Therapy   Number Minutes Moist Heat  10 Minutes    Moist Heat Location  Cervical;Shoulder   Rt     Manual Therapy   Manual therapy comments  skilled palpation to monitor tissue response to DN/manual work     Joint Mobilization  cervical PA mobilization grade II prone    Soft tissue mobilization  deep tissue work through the Rt > Lt upper trap/leveator; posterior cervical musculature; upper trap; leveator; Rt teres/lats     Myofascial Release  posterior cervical musculature to thoracic        Trigger Point Dry Needling - 10/05/19 0001    Consent Given?  Yes    Education Handout Provided  Previously provided    Other Dry Needling  Rt    Upper Trapezius Response  Palpable increased muscle length    Levator Scapulae Response  Palpable increased muscle length    Thoracic multifidi response  Palpable increased muscle length           PT Education - 10/05/19 1655    Education Details  HEP    Person(s) Educated  Patient  Methods  Explanation;Demonstration;Tactile cues;Verbal cues;Handout    Comprehension  Verbalized understanding;Returned demonstration;Verbal cues required;Tactile cues required          PT Long Term Goals - 09/22/19 1646      PT LONG TERM GOAL #1   Title  Decrease pain by 50-75% allowing patient to progress with exercise and return to more normal ADL's including sleep    Time  6    Period  Weeks    Status  Partially Met      PT LONG TERM GOAL #2   Title  Full pain free cervical ROM    Time  6    Period  Weeks    Status  Partially Met      PT LONG TERM GOAL #3   Title  5/5 strength Rt UE    Time  6    Period  Weeks    Status  On-going      PT LONG TERM GOAL #4   Title  Independent in HEP    Time  6    Period  Weeks    Status  On-going      PT LONG TERM GOAL #5   Title  Improve FOTO to </= 34% limitation    Time  6    Period  Weeks    Status  On-going            Plan - 10/05/19 1613    Clinical Impression Statement  Excellent progress with full resolution of radicular symptoms. Patient has incresed cervical and shoudler ROM;  decreased palpable tightness; increasing strength Rt UE. Patient is working on strengthening and stretching as well as postural correction and ergonomic modifications at work and home.    Rehab Potential  Good    PT Frequency  2x / week    PT Duration  6 weeks    PT Treatment/Interventions  Patient/family education;ADLs/Self Care Home Management;Cryotherapy;Electrical Stimulation;Iontophoresis 64m/ml Dexamethasone;Moist Heat;Traction;Ultrasound;Therapeutic activities;Therapeutic exercise;Neuromuscular re-education;Manual techniques;Dry needling;Taping    PT Next Visit Plan  DN and manual work through the cervical and Rt shoulder girdle musculature; postural correction; stretching and strengthening as pt tolerates; modalities as indicated - posterior shoulder girdle strengthening - TB exercises; prone ex. note to MD at next visit    PT HCarterand Agree with Plan of Care  Patient       Patient will benefit from skilled therapeutic intervention in order to improve the following deficits and impairments:     Visit Diagnosis: Radiculopathy, cervical region  Cervicalgia  Muscle weakness (generalized)  Other symptoms and signs involving the musculoskeletal system  Abnormal posture     Problem List Patient Active Problem List   Diagnosis Date Noted  . Radiculitis of right cervical region 08/30/2019  . Primary osteoarthritis of right knee 08/30/2019    Jaion Lagrange PNilda SimmerPT, MPH  10/05/2019, 5:08 PM  [facility]1[place] [address]Phone: [phone]  Fax:  [phone] Name: TCHRISSA MEETZEMRN: [id number] of Birth: 4Apr 13, 1967

## 2019-10-05 NOTE — Patient Instructions (Addendum)
Reverse wall plank -  Standing with back to wall Keep shoulder blades down and back Elbows at side bent to 90 degrees Squeeze with shoulder blades and lift trunk away from wall  Hold 10 sec x 10 reps   Wall Shoulder Press-Out    With palms flat on wall, shoulder-width apart, and elbows straight, hold. Repeat __3-5__ times or for _30-60 sec  __1__ sessions per day.   Shoulder Blade Squeeze: Arms at Sides    Arms at sides, parallel, elbows straight, palms up. Press pelvis down. Squeeze backbone with shoulder blades, raising front of shoulders, chest, and arms. Keep head and neck neutral. Hold __5_ seconds. Relax. Repeat __5-10_ times.    Shoulder Blade Squeeze: W    Arms out to sides at 90 palms down. Bend elbows to 90. Press pelvis down. Squeeze backbone with shoulder blades. Raise arms, front of shoulders, chest, and head. Keep neck neutral. Hold __5_ seconds. Relax. Repeat _5-10__ times.   Shoulder Blade Squeeze: Airplane    Arms out to sides at 90, elbows straight, palms down. Press pelvis down. Squeeze backbone with shoulder blades. Raise arms, front of shoulders, chest, and head. Keep neck neutral. Hold _5__ seconds. Relax. Repeat _5-10__ times.  Shoulder Blade Squeeze: Superperson    Arms alongside head, elbows straight, palms down. Press pelvis down. Squeeze backbone with shoulder blades. Raise arms, chest, and head. Keep neck neutral. Hold _5__ seconds. Relax. Repeat _5-10__ times.

## 2019-10-07 ENCOUNTER — Encounter: Payer: Self-pay | Admitting: Rehabilitative and Restorative Service Providers"

## 2019-10-07 ENCOUNTER — Ambulatory Visit (INDEPENDENT_AMBULATORY_CARE_PROVIDER_SITE_OTHER): Payer: 59 | Admitting: Rehabilitative and Restorative Service Providers"

## 2019-10-07 ENCOUNTER — Other Ambulatory Visit: Payer: Self-pay

## 2019-10-07 DIAGNOSIS — M542 Cervicalgia: Secondary | ICD-10-CM | POA: Diagnosis not present

## 2019-10-07 DIAGNOSIS — R29898 Other symptoms and signs involving the musculoskeletal system: Secondary | ICD-10-CM

## 2019-10-07 DIAGNOSIS — R293 Abnormal posture: Secondary | ICD-10-CM

## 2019-10-07 DIAGNOSIS — M6281 Muscle weakness (generalized): Secondary | ICD-10-CM | POA: Diagnosis not present

## 2019-10-07 DIAGNOSIS — M5412 Radiculopathy, cervical region: Secondary | ICD-10-CM

## 2019-10-07 NOTE — Therapy (Addendum)
[facility] [place], [place], [address] Phone: [phone]   Fax:  [phone]  Physical Therapy Treatment  Patient Details  Name: ASPYN WARNKE MRN: [id number] Date of Birth: 12-29-1965 Referring Provider (PT): Dr Dianah Field    Encounter Date: 10/07/2019   PT End of Session - 10/07/19 1534    Visit Number 10    Number of Visits 12    Date for PT Re-Evaluation 10/17/19    PT Start Time 1532    PT Stop Time 1620    PT Time Calculation (min) 48 min    Activity Tolerance Patient tolerated treatment well           Past Medical History:  Diagnosis Date  . Anxiety   . Diabetes mellitus without complication ([place])   . Hypertension     Past Surgical History:  Procedure Laterality Date  . LAPAROSCOPY      There were no vitals filed for this visit.   Subjective Assessment - 10/07/19 1537    Subjective Patient reports that she is tired today. She has no pain into the Rt UE and has not had symptoms in the Rt UE in a few weeks - can't remember how long it's been.    Currently in Pain? No/denies              [facility] PT Assessment - 10/07/19 0001      Assessment   Medical Diagnosis Rt Cervical radiculopathy     Referring Provider (PT) Dr Dianah Field     Onset Date/Surgical Date 06/27/19    Hand Dominance Right    Next MD Visit 10/11/19    Prior Therapy chiropractic care 2-3 weeks; massage therapy       Observation/Other Assessments   Focus on Therapeutic Outcomes (FOTO)  34% limitation      AROM   Right Shoulder Flexion 144 Degrees    Right Shoulder ABduction 162 Degrees    Right Shoulder External Rotation 95 Degrees    Left Shoulder Flexion 156 Degrees    Left Shoulder ABduction 155 Degrees    Left Shoulder External Rotation 92 Degrees    Cervical Flexion 57    Cervical Extension 55    Cervical - Right Side Bend 43    Cervical - Left Side Bend 41    Cervical - Right Rotation 80     Cervical - Left Rotation 74      Strength   Right Shoulder Flexion 4/5    Right Shoulder Extension 5/5    Right Shoulder ABduction --   5-/5   Right Shoulder Internal Rotation 5/5    Right Shoulder External Rotation --   5-/5     Palpation   Spinal mobility improving cervical and upper thoracic spine mobility with CPA and lateral mobs                          [facility] Adult PT Treatment/Exercise - 10/07/19 0001      Neck Exercises: Theraband   Other Theraband Exercises horizontal row; ER; press up then reverse with red then yellow TB. Decreased resistance due to patterns of substitution pt hiking Rt shoulder       Neck Exercises: Standing   Other Standing Exercises wall plank 30 sec x 4 reps       Neck Exercises: Prone   Other Prone Exercise axial ext, shoulder extension x 10 reps with scap squeeze x 5 reps;  W's x 5 sec hold x 5 reps; T's 5 sec hold x 5; superman 5 sec hold x 5       Shoulder Exercises: Standing   External Rotation Limitations ball at dorsum of hand shd/elbow 90/90 - small circles on wall x ~ 30 - 40 sec x 2 reps     Flexion Strengthening;Right;10 reps;Theraband    Theraband Level (Shoulder Flexion) Level 1 (Yellow)   VC to keep scapula down and back    Extension Strengthening;Both;20 reps;Theraband    Theraband Level (Shoulder Extension) Level 2 (Red)    Row Strengthening;Both;20 reps;Theraband    Theraband Level (Shoulder Row) Level 2 (Red)    Retraction Strengthening;Both;Theraband;20 reps    Theraband Level (Shoulder Retraction) Level 1 (Yellow)    Other Standing Exercises axial extension 5 sec x 5 reps; scap squeeze 10 sec x 10 reps; L's x 10 all with noodle along spine       Shoulder Exercises: ROM/Strengthening   UBE (Upper Arm Bike) L3 x 2 minutes forward/ 2 minute backward      Moist Heat Therapy   Number Minutes Moist Heat 10 Minutes    Moist Heat Location Cervical;Shoulder   Rt     Manual Therapy   Manual therapy comments skilled  palpation to monitor tissue response to DN/manual work     Joint Mobilization cervical PA mobilization grade II prone    Soft tissue mobilization deep tissue work through the Rt > Lt upper trap/leveator; posterior cervical musculature; upper trap; leveator; Rt teres/lats     Myofascial Release posterior cervical musculature to thoracic             Trigger Point Dry Needling - 10/07/19 0001    Consent Given? Yes    Education Handout Provided Previously provided    Other Dry Needling Rt    Upper Trapezius Response Palpable increased muscle length    Levator Scapulae Response Palpable increased muscle length    Cervical multifidi Response Palpable increased muscle length    Thoracic multifidi response Palpable increased muscle length                PT Education - 10/07/19 1619    Education Details HEP    Person(s) Educated Patient    Methods Explanation;Demonstration;Tactile cues;Verbal cues;Handout    Comprehension Verbalized understanding;Returned demonstration;Verbal cues required;Tactile cues required               PT Long Term Goals - 09/22/19 1646      PT LONG TERM GOAL #1   Title Decrease pain by 50-75% allowing patient to progress with exercise and return to more normal ADL's including sleep    Time 6    Period Weeks    Status Partially Met      PT LONG TERM GOAL #2   Title Full pain free cervical ROM    Time 6    Period Weeks    Status Partially Met      PT LONG TERM GOAL #3   Title 5/5 strength Rt UE    Time 6    Period Weeks    Status On-going      PT LONG TERM GOAL #4   Title Independent in HEP    Time 6    Period Weeks    Status On-going      PT LONG TERM GOAL #5   Title Improve FOTO to </= 34% limitation    Time 6    Period Weeks  Status On-going                 Plan - 10/07/19 1535    Clinical Impression Statement Excellent progress with full resolution of radicular symptoms. Patient has increased cervical and shoulder ROM  and increased UE strength; decreased palpable tightness. 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    Rehab Potential Good    PT Frequency 2x / week    PT Duration 6 weeks    PT Treatment/Interventions Patient/family education;ADLs/Self Care Home Management;Cryotherapy;Electrical Stimulation;Iontophoresis 28m/ml Dexamethasone;Moist Heat;Traction;Ultrasound;Therapeutic activities;Therapeutic exercise;Neuromuscular re-education;Manual techniques;Dry needling;Taping    PT Next Visit Plan DN and manual work through the cervical and Rt shoulder girdle musculature; postural correction; stretching and strengthening as pt tolerates; modalities as indicated - posterior shoulder girdle strengthening - TB exercises; prone ex. note to MD at next visit    [facility]    Consulted and Agree with Plan of Care Patient           Patient will benefit from skilled therapeutic intervention in order to improve the following deficits and impairments:     Visit Diagnosis: Radiculopathy, cervical region  Cervicalgia  Muscle weakness (generalized)  Other symptoms and signs involving the musculoskeletal system  Abnormal posture     Problem List Patient Active Problem List   Diagnosis Date Noted  . Radiculitis of right cervical region 08/30/2019  . Primary osteoarthritis of right knee 08/30/2019    Hartley Wyke PNilda SimmerPT,MPH  10/07/2019, 4:36 PM  [facility] [address]Phone: [phone]  Fax:  [phone] Name: TKYNNEDY CARRENOMRN: [id number] of Birth: [birth year] PHYSICAL THERAPY DISCHARGE SUMMARY  Visits from Start of Care: 10  Current functional level related to goals / functional outcomes: See last progress note for discharge note. Excellent progress with PT. Goals accomplished.    Remaining  deficits: Needs to continue with HEP and postural correction to avoid recurrent symptoms    Education / Equipment: HEP  Plan: Patient agrees to discharge.  Patient goals were met. Patient is being discharged due to meeting the stated rehab goals.  ?????     Keyondra Lagrand P. HHelene KelpPT, MPH 11/10/19 9:06 AM

## 2019-10-07 NOTE — Patient Instructions (Signed)
Access Code: [email or web address]: [email or web address]: 06/11/2021Prepared by: Mette Southgate HoltExercises  Seated Cervical Retraction - 3-4 x daily - 7 x weekly - 1 sets - 5 reps - 10 sec hold  Seated Scapular Retraction - 3-4 x daily - 7 x weekly - 1 sets - 10 reps - 5-10 sec hold  Shoulder External Rotation and Scapular Retraction - 2 x daily - 7 x weekly - 1 sets - 10 reps - 1-2 sec hold  Doorway Pec Stretch at 60 Degrees Abduction - 3 x daily - 7 x weekly - 3 reps - 1 sets  Doorway Pec Stretch at 90 Degrees Abduction - 3 x daily - 7 x weekly - 3 reps - 1 sets - 30 seconds hold  Doorway Pec Stretch at 120 Degrees Abduction - 3 x daily - 7 x weekly - 3 reps - 1 sets - 30 second hold hold  Standing Shoulder Flexion with Dumbbells - 1 x daily - 7 x weekly - 1-2 sets - 10 reps  Standing Single Arm Shoulder Abduction with Dumbbell - Thumb Up - 1 x daily - 7 x weekly - 1-2 sets - 10 reps  Standing Lat Pull Down with Resistance - Elbows Bent - 1 x daily - 7 x weekly - 1-2 sets - 10 reps - 3 sec hold  Isometric Shoulder External Rotation in Abduction with Ball at Wall - 1 x daily - 7 x weekly - 1 sets - 3 reps - 30 sec hold  Standing Shoulder Flexion with Resistance - 1 x daily - 7 x weekly - 1 sets - 10 reps - 1-2 sec hold  Isometric Shoulder Abduction at Wall - 1 x daily - 7 x weekly - 1 sets - 10 reps - 3 sec hold

## 2019-10-11 ENCOUNTER — Ambulatory Visit (INDEPENDENT_AMBULATORY_CARE_PROVIDER_SITE_OTHER): Payer: 59 | Admitting: Sports Medicine

## 2019-10-11 ENCOUNTER — Other Ambulatory Visit: Payer: Self-pay

## 2019-10-11 ENCOUNTER — Encounter: Payer: Self-pay | Admitting: Sports Medicine

## 2019-10-11 DIAGNOSIS — M1711 Unilateral primary osteoarthritis, right knee: Secondary | ICD-10-CM

## 2019-10-11 DIAGNOSIS — M5412 Radiculopathy, cervical region: Secondary | ICD-10-CM | POA: Diagnosis not present

## 2019-10-11 NOTE — Assessment & Plan Note (Signed)
This is a very pleasant 54 year old female, she has pain at the medial joint line, with gelling, x-rays showed mild osteoarthritis, she has now done 6 weeks of physical therapy and feels really good.

## 2019-10-11 NOTE — Progress Notes (Signed)
    Procedures performed today:    None.  Independent interpretation of notes and tests performed by another provider:   None.  Brief History, Exam, Impression, and Recommendations:    Primary osteoarthritis of right knee This is a very pleasant 54 year old female, she has pain at the medial joint line, with gelling, x-rays showed mild osteoarthritis, she has now done 6 weeks of physical therapy and feels really good.  Radiculitis of right cervical region She has completed 6 weeks of physical therapy including dry needling, she was having C6 and C7 radiculitis down the right side, she has responded extremely well, return as needed.    ___________________________________________ Ihor Austin. Benjamin Stain, M.D., ABFM., CAQSM. Primary Care and Sports Medicine [facility]  Adjunct Instructor of Family Medicine  [employer]

## 2019-10-11 NOTE — Assessment & Plan Note (Signed)
She has completed 6 weeks of physical therapy including dry needling, she was having C6 and C7 radiculitis down the right side, she has responded extremely well, return as needed.

## 2020-06-09 DIAGNOSIS — M549 Dorsalgia, unspecified: Secondary | ICD-10-CM | POA: Diagnosis not present

## 2020-06-25 DIAGNOSIS — Z1231 Encounter for screening mammogram for malignant neoplasm of breast: Secondary | ICD-10-CM | POA: Diagnosis not present

## 2020-07-23 DIAGNOSIS — Z01419 Encounter for gynecological examination (general) (routine) without abnormal findings: Secondary | ICD-10-CM | POA: Diagnosis not present

## 2020-07-23 DIAGNOSIS — Z6841 Body Mass Index (BMI) 40.0 and over, adult: Secondary | ICD-10-CM | POA: Diagnosis not present

## 2020-07-23 DIAGNOSIS — Z124 Encounter for screening for malignant neoplasm of cervix: Secondary | ICD-10-CM | POA: Diagnosis not present

## 2020-07-23 DIAGNOSIS — N951 Menopausal and female climacteric states: Secondary | ICD-10-CM | POA: Diagnosis not present

## 2021-01-14 DIAGNOSIS — E119 Type 2 diabetes mellitus without complications: Secondary | ICD-10-CM | POA: Diagnosis not present

## 2021-01-14 DIAGNOSIS — E785 Hyperlipidemia, unspecified: Secondary | ICD-10-CM | POA: Diagnosis not present

## 2021-01-14 DIAGNOSIS — Z8616 Personal history of COVID-19: Secondary | ICD-10-CM | POA: Diagnosis not present

## 2021-01-14 DIAGNOSIS — Z79899 Other long term (current) drug therapy: Secondary | ICD-10-CM | POA: Diagnosis not present

## 2021-01-14 DIAGNOSIS — R059 Cough, unspecified: Secondary | ICD-10-CM | POA: Diagnosis not present

## 2021-02-27 DIAGNOSIS — L209 Atopic dermatitis, unspecified: Secondary | ICD-10-CM | POA: Diagnosis not present

## 2021-02-27 DIAGNOSIS — D225 Melanocytic nevi of trunk: Secondary | ICD-10-CM | POA: Diagnosis not present

## 2022-04-09 IMAGING — DX DG CERVICAL SPINE COMPLETE 4+V
6 series · 6 of 6 positions shown · non-contrast
Comparison: None.

CLINICAL DATA: Right-sided neck pain. No known injury.

EXAM:
CERVICAL SPINE - COMPLETE 4+ VIEW

[c-spine lat]
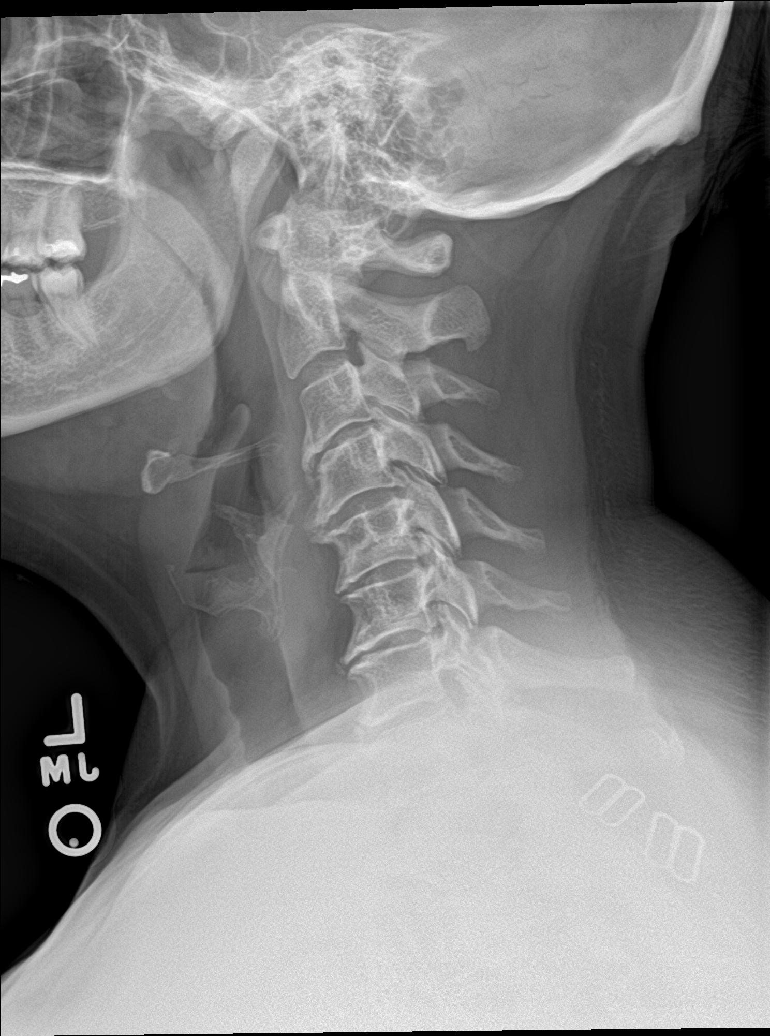

[c-spine obl (1 of 2)]
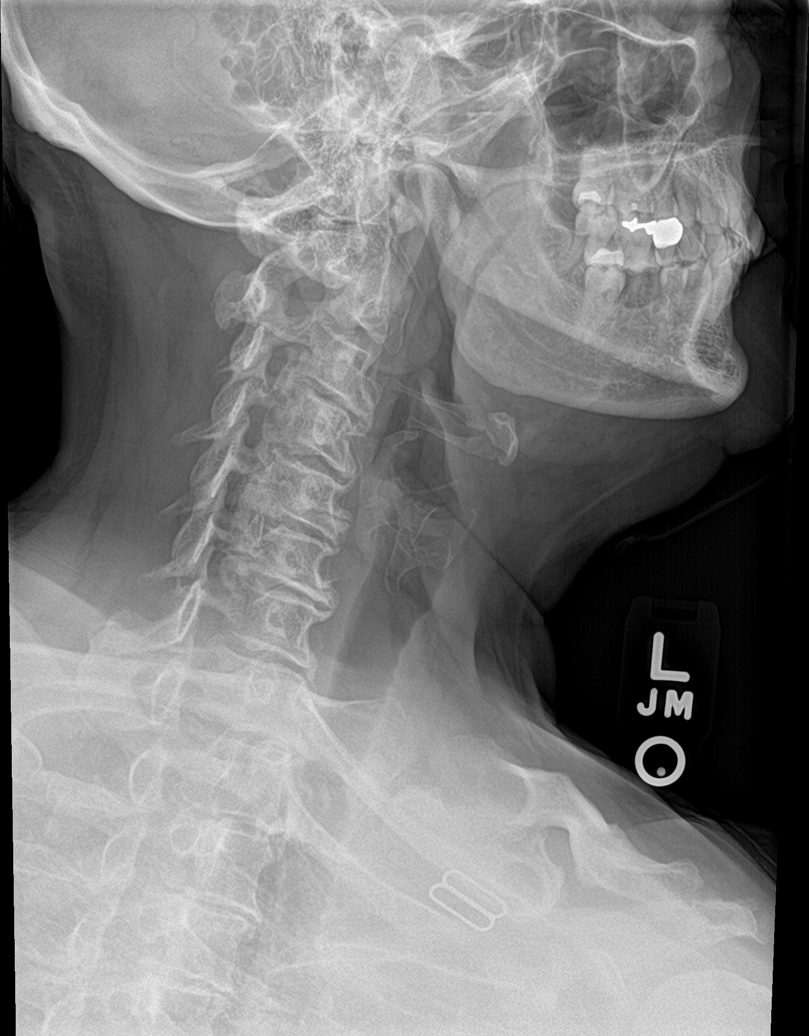

[c-spine obl (2 of 2)]
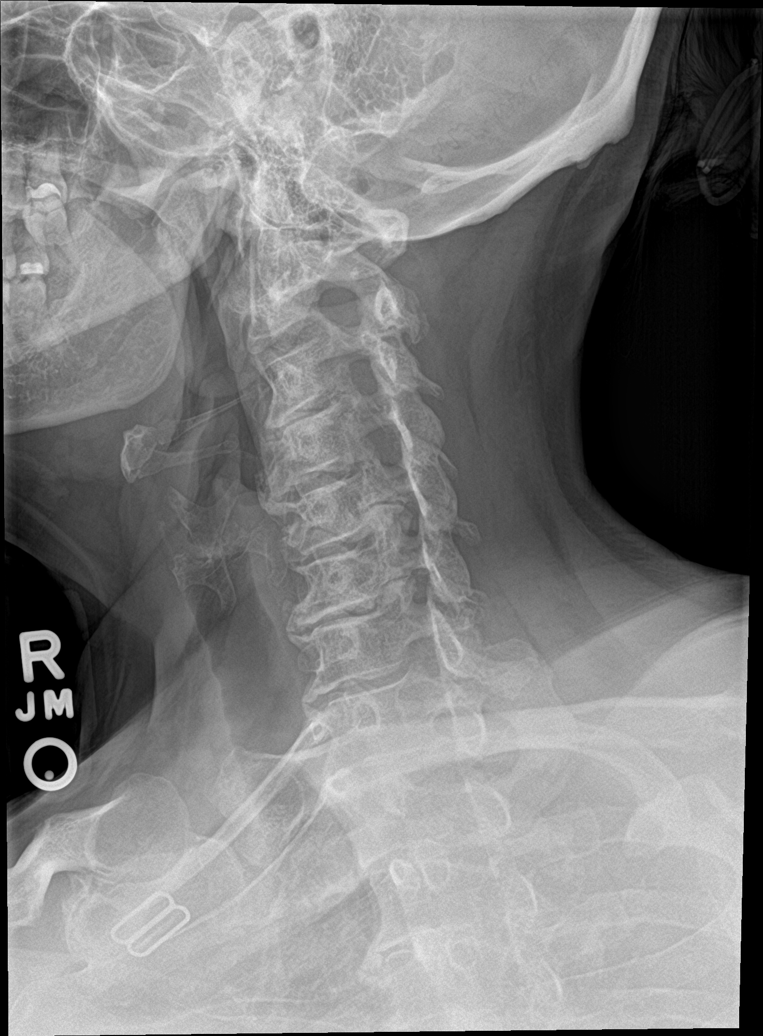

[c-spine ap]
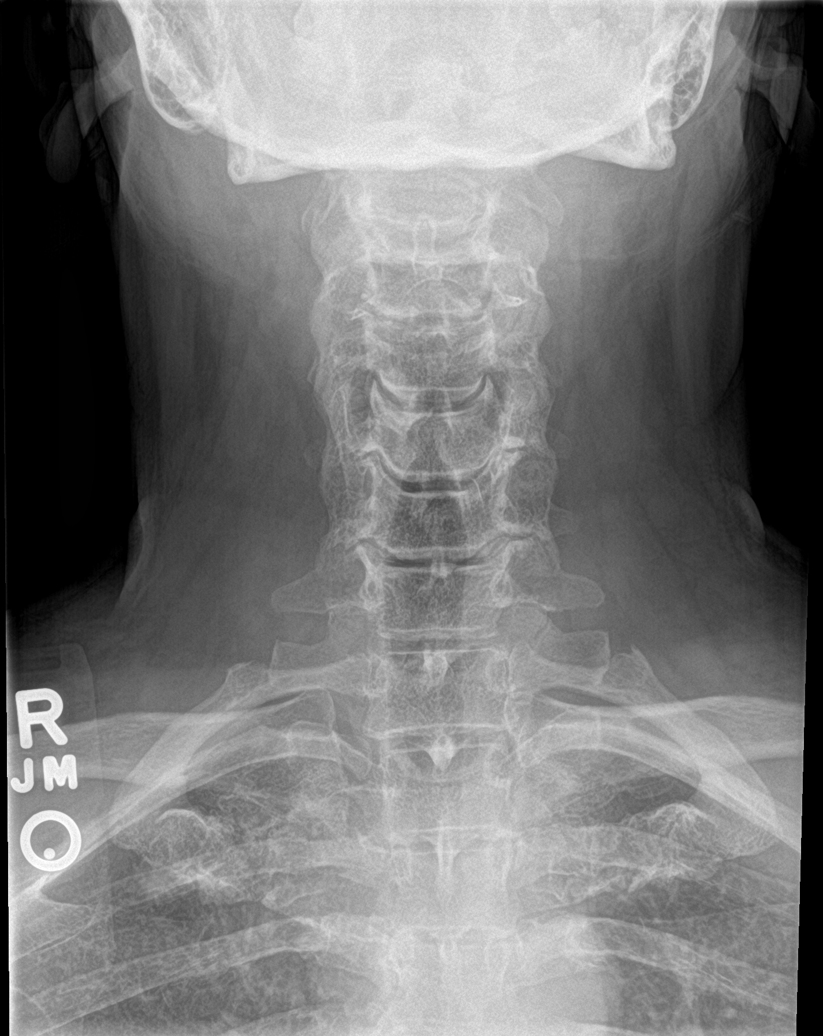

[c-spine open mouth]
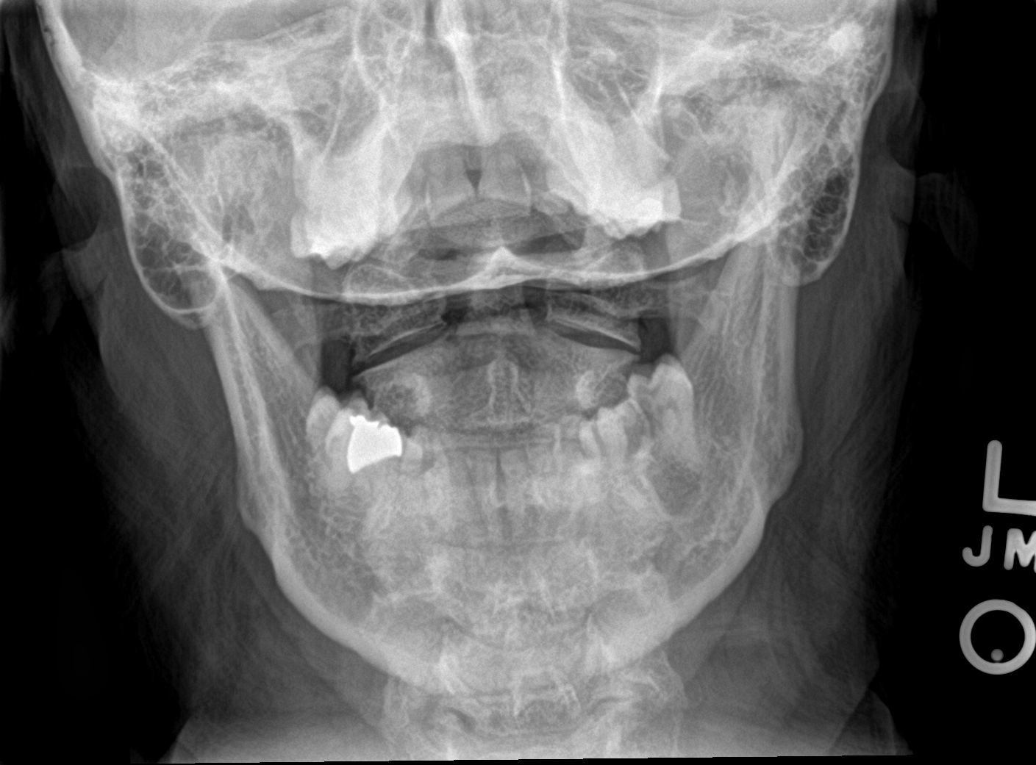

[[person_name]]
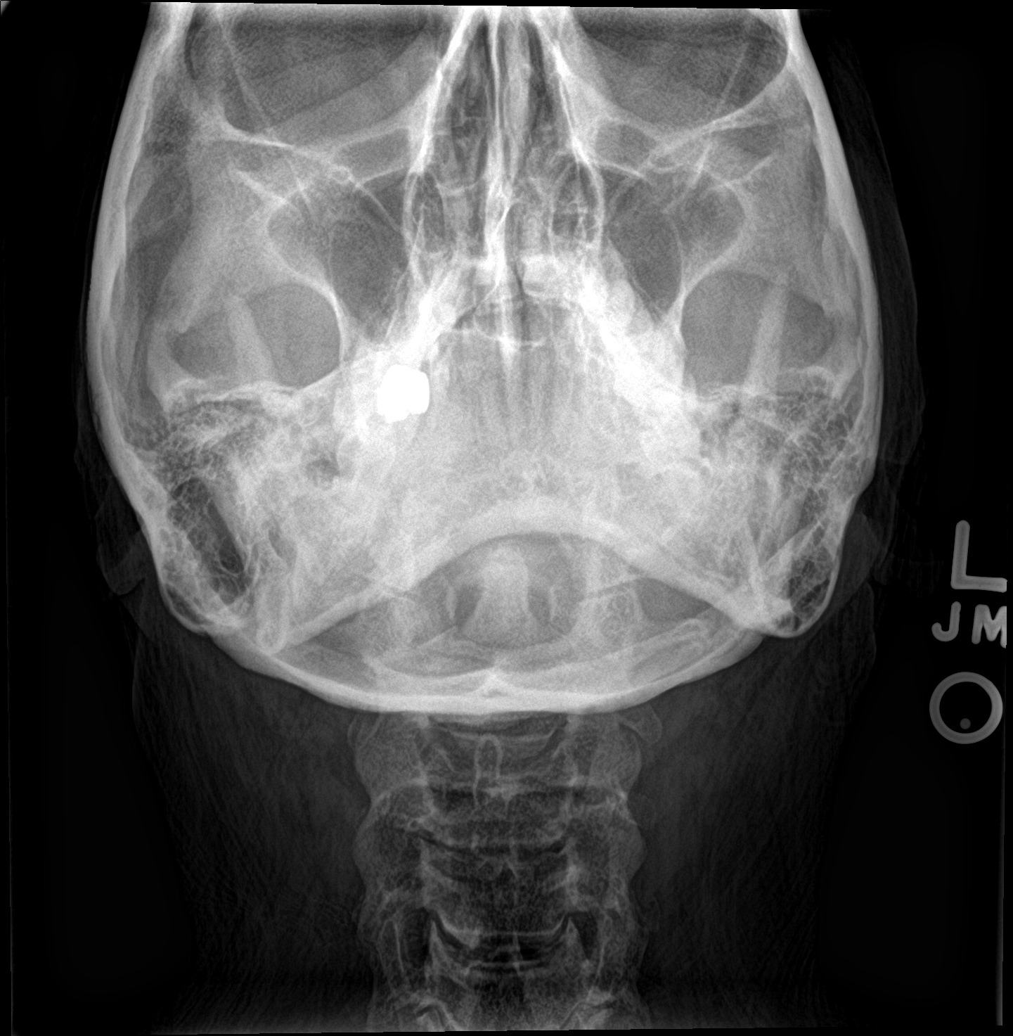

[6 of 6 positions shown; findings below may reference images not displayed]

FINDINGS: There is no evidence of cervical spine fracture or prevertebral soft
tissue swelling. Alignment is normal. Mild-to-moderate degenerative
disc disease is seen from levels of C3-T1. Bilateral neural
foraminal narrowing is seen due to uncovertebral spurring, greatest
at C5-6 and C6-7. No significant facet DJD or other focal bone
lesions. Mild cervical kyphosis noted.
IMPRESSION: 1. No acute findings.
2. Degenerative spondylosis from C3-T1, as described above.
